# Patient Record
Sex: Female | Born: 1980 | Race: White | Hispanic: No | Smoking: Former smoker
Health system: Southern US, Community
[De-identification: ages and names within clinical notes are randomized; demographics above are authoritative.]

## PROBLEM LIST (undated history)

## (undated) DIAGNOSIS — R569 Unspecified convulsions: Secondary | ICD-10-CM

## (undated) DIAGNOSIS — R42 Dizziness and giddiness: Secondary | ICD-10-CM

## (undated) DIAGNOSIS — N201 Calculus of ureter: Secondary | ICD-10-CM

## (undated) DIAGNOSIS — N200 Calculus of kidney: Secondary | ICD-10-CM

## (undated) DIAGNOSIS — J45909 Unspecified asthma, uncomplicated: Secondary | ICD-10-CM

## (undated) DIAGNOSIS — Z973 Presence of spectacles and contact lenses: Secondary | ICD-10-CM

## (undated) DIAGNOSIS — M1990 Unspecified osteoarthritis, unspecified site: Secondary | ICD-10-CM

## (undated) HISTORY — DX: Unspecified osteoarthritis, unspecified site: M19.90

## (undated) HISTORY — PX: KNEE SURGERY: SHX244

## (undated) HISTORY — DX: Unspecified convulsions: R56.9

## (undated) HISTORY — DX: Dizziness and giddiness: R42

---
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
## 2002-02-03 ENCOUNTER — Emergency Department (HOSPITAL_COMMUNITY): Admission: EM | Admit: 2002-02-03 | Discharge: 2002-02-03 | Payer: Self-pay | Admitting: Internal Medicine

## 2002-07-24 ENCOUNTER — Emergency Department (HOSPITAL_COMMUNITY): Admission: EM | Admit: 2002-07-24 | Discharge: 2002-07-24 | Payer: Self-pay | Admitting: Emergency Medicine

## 2004-09-05 HISTORY — PX: TUBAL LIGATION: SHX77

## 2012-06-14 ENCOUNTER — Emergency Department (HOSPITAL_COMMUNITY): Payer: No Typology Code available for payment source

## 2012-06-14 ENCOUNTER — Encounter (HOSPITAL_COMMUNITY): Payer: Self-pay

## 2012-06-14 ENCOUNTER — Emergency Department (HOSPITAL_COMMUNITY)
Admission: EM | Admit: 2012-06-14 | Discharge: 2012-06-14 | Disposition: A | Discharge status: Home / Self Care | Payer: No Typology Code available for payment source | Attending: Emergency Medicine | Admitting: Emergency Medicine

## 2012-06-14 DIAGNOSIS — M545 Low back pain, unspecified: Secondary | ICD-10-CM | POA: Insufficient documentation

## 2012-06-14 DIAGNOSIS — S29019A Strain of muscle and tendon of unspecified wall of thorax, initial encounter: Secondary | ICD-10-CM

## 2012-06-14 DIAGNOSIS — F17200 Nicotine dependence, unspecified, uncomplicated: Secondary | ICD-10-CM | POA: Insufficient documentation

## 2012-06-14 DIAGNOSIS — M546 Pain in thoracic spine: Secondary | ICD-10-CM | POA: Insufficient documentation

## 2012-06-14 DIAGNOSIS — S139XXA Sprain of joints and ligaments of unspecified parts of neck, initial encounter: Secondary | ICD-10-CM | POA: Insufficient documentation

## 2012-06-14 DIAGNOSIS — S161XXA Strain of muscle, fascia and tendon at neck level, initial encounter: Secondary | ICD-10-CM

## 2012-06-14 DIAGNOSIS — M542 Cervicalgia: Secondary | ICD-10-CM | POA: Insufficient documentation

## 2012-06-14 DIAGNOSIS — S239XXA Sprain of unspecified parts of thorax, initial encounter: Secondary | ICD-10-CM | POA: Insufficient documentation

## 2012-06-14 MED ORDER — OXYCODONE-ACETAMINOPHEN 5-325 MG PO TABS: 1.0000 | ORAL_TABLET | ORAL | Status: AC | PRN

## 2012-06-14 NOTE — ED Notes (Signed)
c-collar placed in triage, due to complaints of neck pain.
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
## 2013-10-14 ENCOUNTER — Encounter (HOSPITAL_COMMUNITY): Payer: Self-pay | Admitting: Emergency Medicine

## 2013-10-14 ENCOUNTER — Emergency Department (HOSPITAL_COMMUNITY): Payer: 59

## 2013-10-14 ENCOUNTER — Emergency Department (HOSPITAL_COMMUNITY)
Admission: EM | Admit: 2013-10-14 | Discharge: 2013-10-14 | Disposition: A | Discharge status: Home / Self Care | Payer: 59 | Attending: Emergency Medicine | Admitting: Emergency Medicine

## 2013-10-14 DIAGNOSIS — Z87891 Personal history of nicotine dependence: Secondary | ICD-10-CM | POA: Insufficient documentation

## 2013-10-14 DIAGNOSIS — S61209A Unspecified open wound of unspecified finger without damage to nail, initial encounter: Secondary | ICD-10-CM | POA: Insufficient documentation

## 2013-10-14 DIAGNOSIS — S61309A Unspecified open wound of unspecified finger with damage to nail, initial encounter: Secondary | ICD-10-CM

## 2013-10-14 DIAGNOSIS — Y9389 Activity, other specified: Secondary | ICD-10-CM | POA: Insufficient documentation

## 2013-10-14 DIAGNOSIS — J45909 Unspecified asthma, uncomplicated: Secondary | ICD-10-CM | POA: Insufficient documentation

## 2013-10-14 DIAGNOSIS — Y929 Unspecified place or not applicable: Secondary | ICD-10-CM | POA: Insufficient documentation

## 2013-10-14 DIAGNOSIS — S6990XA Unspecified injury of unspecified wrist, hand and finger(s), initial encounter: Secondary | ICD-10-CM | POA: Insufficient documentation

## 2013-10-14 DIAGNOSIS — W230XXA Caught, crushed, jammed, or pinched between moving objects, initial encounter: Secondary | ICD-10-CM | POA: Insufficient documentation

## 2013-10-14 DIAGNOSIS — Z23 Encounter for immunization: Secondary | ICD-10-CM | POA: Insufficient documentation

## 2013-10-14 HISTORY — DX: Unspecified asthma, uncomplicated: J45.909

## 2013-10-14 MED ORDER — TETANUS-DIPHTH-ACELL PERTUSSIS 5-2.5-18.5 LF-MCG/0.5 IM SUSP: 0.5000 mL | Freq: Once | INTRAMUSCULAR | Status: DC

## 2013-10-14 MED ORDER — BUPIVACAINE HCL (PF) 0.5 % IJ SOLN
INTRAMUSCULAR | Status: AC
  Administered 2013-10-14: 20:00:00
  Filled 2013-10-14: qty 30

## 2013-10-14 MED ORDER — TETANUS-DIPHTHERIA TOXOIDS TD 5-2 LFU IM INJ
0.5000 mL | INJECTION | Freq: Once | INTRAMUSCULAR | Status: AC
Start: 2013-10-14 — End: 2013-10-14
  Administered 2013-10-14: 0.5 mL via INTRAMUSCULAR
  Filled 2013-10-14: qty 0.5

## 2013-10-14 MED ORDER — NAPROXEN 500 MG PO TABS: 500.0000 mg | ORAL_TABLET | Freq: Two times a day (BID) | ORAL | Status: DC

## 2013-10-14 MED ORDER — TETANUS-DIPHTH-ACELL PERTUSSIS 5-2.5-18.5 LF-MCG/0.5 IM SUSP
INTRAMUSCULAR | Status: AC
  Filled 2013-10-14: qty 0.5

## 2013-10-14 NOTE — Discharge Instructions (Signed)
Nail Avulsion Injury Nail avulsion means that you have lost the whole, or part of a nail. The nail will usually grow back in 2 to 6 months. If your injury damaged the growth center of the nail, the nail may be deformed, split, or not stuck to the nail bed. Sometimes the avulsed nail is stitched back in place. This provides temporary protection to the nail bed until the new nail grows in.  HOME CARE INSTRUCTIONS   Raise (elevate) your injury as much as possible.  Use an ice pack for pain relief.  Protect the injury and cover it with bandages (dressings) or splints as instructed.  Change dressings as instructed. SEEK MEDICAL CARE IF:   There is increasing pain, redness, or swelling.  You cannot move your fingers or toes. Document Released: 09/29/2004 Document Revised: 11/14/2011 Document Reviewed: 07/24/2009 [facility] Patient Information 2014 [employer] [place], [place].
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
## 2013-10-15 NOTE — ED Provider Notes (Signed)
CSN: [id number]     Arrival date & time 10/14/13  1703 History   First MD Initiated Contact with Patient 10/14/13 1855     Chief Complaint  Patient presents with  . Hand Pain     (Consider location/radiation/quality/duration/timing/severity/associated sxs/prior Treatment) HPI Comments: Susan Jensen is a 33 y.o. Female presenting with a crush injury and partial nail avulsion to her right long finger when she caught it in her car door just prior to arrival.  Her right index and ring fingers are also sore, but tolerable compared to the long finger.  She wears acrylic nails and has continued bleeding from along the edge of the nail plate.  Her pain is constant, throbbing and does not radiate.  She has used ice and elevation with mild improvement in pain.     The history is provided by the patient.    Past Medical History  Diagnosis Date  . Asthma    Past Surgical History  Procedure Laterality Date  . Knee surgery    . Tubal ligation     Family History  Problem Relation Age of Onset  . Cancer Other   . Heart failure Other    History  Substance Use Topics  . Smoking status: Former Smoker -- 1.00 packs/day for 10 years    Types: Cigarettes    Quit date: 04/07/2013  . Smokeless tobacco: Never Used  . Alcohol Use: No   OB History   Grav Para Term Preterm Abortions TAB SAB Ect Mult Living   3 3 3       3      Review of Systems  Constitutional: Negative for fever.  Musculoskeletal: Positive for arthralgias and joint swelling. Negative for myalgias.  Neurological: Negative for weakness and numbness.      Allergies  Ibuprofen and Pertussis vaccines  Home Medications   Current Outpatient Rx  Name  Route  Sig  Dispense  Refill  . naproxen (NAPROSYN) 500 MG tablet   Oral   Take 1 tablet (500 mg total) by mouth 2 (two) times daily.   30 tablet   0    BP 142/82  Pulse 86  Temp(Src) 98.4 F (36.9 C) (Oral)  Resp 20  Ht 5\' 4"  (1.626 m)  Wt 200 lb (90.719 kg)   BMI 34.31 kg/m2  SpO2 100%  LMP 10/14/2013 Physical Exam  Constitutional: She is oriented to person, place, and time. She appears well-developed and well-nourished.  HENT:  Head: Atraumatic.  Cardiovascular:  Pulses equal bilaterally  Musculoskeletal: She exhibits tenderness.       Right hand: She exhibits decreased range of motion and tenderness. She exhibits normal capillary refill, no deformity and no swelling.  Pain and erythema right distal long finger,  Nail plate with acrylic nail avulsed distal one third, linear.  Blood slowly oozing from along fractured nail plate edge.  No nailbed laceration appreciated.  Distal cap refill less than 3 seconds, sensation intact. Remaining nail plate is solidly attached.  Neurological: She is alert and oriented to person, place, and time. No sensory deficit.  Skin: Skin is warm and dry.  Psychiatric: She has a normal mood and affect.    ED Course  Procedures (including critical care time) Labs Review Labs Reviewed - No data to display Imaging Review Dg Hand Complete Right  10/14/2013   CLINICAL DATA:  Hand injury with pain in the distal fingers.  EXAM: RIGHT HAND - COMPLETE 3+ VIEW  COMPARISON:  None.  FINDINGS: No acute  osseous or joint abnormality.  IMPRESSION: No acute osseous or joint abnormality.   Electronically Signed   By: Leanna BattlesMelinda  Blietz M.D.   On: 10/14/2013 18:48    EKG Interpretation   None       MDM   Final diagnoses:  Fingernail avulsion, partial    xrays reviewed and negative for fracture.  Pt was given a digital block using 0.5% bupivacaine 1 cc using sterile technique,  Adequate block achieved prior to exploration of wound.  Pt was advised she may lose the remaining nail plate but is currently intact and will remain giving protection as the nail grows out. Advised to keep it clipped short to avoid further injury to nail/bed. Pt understands.  She was dressed in a bulky dressing, prescribed naproxen, encouraged  ice/elevation.  Tetanus updated.  Encouraged f/u with pcp in 7-10 days if pain not improving or for other concerns.    Burgess AmorJulie Shakendra Griffeth, PA-C 10/15/13 1529

## 2013-10-17 NOTE — ED Provider Notes (Signed)
Medical screening examination/treatment/procedure(s) were performed by non-physician practitioner and as supervising physician I was immediately available for consultation/collaboration.  EKG Interpretation   None         Iliya Spivack L Ryszard Socarras, MD 10/17/13 1434 

## 2014-07-07 ENCOUNTER — Encounter (HOSPITAL_COMMUNITY): Payer: Self-pay | Admitting: Emergency Medicine

## 2014-11-24 ENCOUNTER — Emergency Department (HOSPITAL_COMMUNITY)
Admission: EM | Admit: 2014-11-24 | Discharge: 2014-11-24 | Disposition: A | Discharge status: Home / Self Care | Payer: 59 | Attending: Emergency Medicine | Admitting: Emergency Medicine

## 2014-11-24 ENCOUNTER — Emergency Department (HOSPITAL_COMMUNITY): Payer: 59

## 2014-11-24 ENCOUNTER — Encounter (HOSPITAL_COMMUNITY): Payer: Self-pay

## 2014-11-24 DIAGNOSIS — Z79899 Other long term (current) drug therapy: Secondary | ICD-10-CM | POA: Diagnosis not present

## 2014-11-24 DIAGNOSIS — N390 Urinary tract infection, site not specified: Secondary | ICD-10-CM | POA: Diagnosis not present

## 2014-11-24 DIAGNOSIS — Z3202 Encounter for pregnancy test, result negative: Secondary | ICD-10-CM | POA: Insufficient documentation

## 2014-11-24 DIAGNOSIS — N201 Calculus of ureter: Secondary | ICD-10-CM | POA: Diagnosis not present

## 2014-11-24 DIAGNOSIS — Z791 Long term (current) use of non-steroidal anti-inflammatories (NSAID): Secondary | ICD-10-CM | POA: Insufficient documentation

## 2014-11-24 DIAGNOSIS — R109 Unspecified abdominal pain: Secondary | ICD-10-CM

## 2014-11-24 DIAGNOSIS — J45909 Unspecified asthma, uncomplicated: Secondary | ICD-10-CM | POA: Insufficient documentation

## 2014-11-24 DIAGNOSIS — Z9851 Tubal ligation status: Secondary | ICD-10-CM | POA: Insufficient documentation

## 2014-11-24 DIAGNOSIS — Z87891 Personal history of nicotine dependence: Secondary | ICD-10-CM | POA: Diagnosis not present

## 2014-11-24 LAB — CBC
HEMATOCRIT: 36.5 % (ref 36.0–46.0)
Hemoglobin: 12.7 g/dL (ref 12.0–15.0)
MCH: 29.7 pg (ref 26.0–34.0)
MCHC: 34.8 g/dL (ref 30.0–36.0)
MCV: 85.3 fL (ref 78.0–100.0)
Platelets: 346 10*3/uL (ref 150–400)
RBC: 4.28 MIL/uL (ref 3.87–5.11)
RDW: 12.7 % (ref 11.5–15.5)
WBC: 12 10*3/uL — AB (ref 4.0–10.5)

## 2014-11-24 LAB — URINALYSIS, ROUTINE W REFLEX MICROSCOPIC
GLUCOSE, UA: NEGATIVE mg/dL
Leukocytes, UA: NEGATIVE
Nitrite: POSITIVE — AB
Specific Gravity, Urine: >1.03 — ABNORMAL HIGH (ref 1.005–1.030)
UROBILINOGEN UA: 1 mg/dL (ref 0.0–1.0)
pH: 6.5 (ref 5.0–8.0)

## 2014-11-24 LAB — URINE MICROSCOPIC-ADD ON

## 2014-11-24 LAB — BASIC METABOLIC PANEL
Anion gap: 9 (ref 5–15)
BUN: 19 mg/dL (ref 6–23)
CO2: 22 mmol/L (ref 19–32)
CREATININE: 0.72 mg/dL (ref 0.50–1.10)
Calcium: 9.2 mg/dL (ref 8.4–10.5)
Chloride: 109 mmol/L (ref 96–112)
GLUCOSE: 104 mg/dL — AB (ref 70–99)
POTASSIUM: 3.7 mmol/L (ref 3.5–5.1)
Sodium: 140 mmol/L (ref 135–145)

## 2014-11-24 LAB — PREGNANCY, URINE: PREG TEST UR: NEGATIVE

## 2014-11-24 MED ORDER — HYDROMORPHONE HCL 1 MG/ML IJ SOLN
1.0000 mg | Freq: Once | INTRAMUSCULAR | Status: AC
  Administered 2014-11-24: 1 mg via INTRAVENOUS
  Filled 2014-11-24: qty 1

## 2014-11-24 MED ORDER — ONDANSETRON HCL 4 MG/2ML IJ SOLN
4.0000 mg | Freq: Once | INTRAMUSCULAR | Status: AC
  Administered 2014-11-24: 4 mg via INTRAVENOUS
  Filled 2014-11-24: qty 2

## 2014-11-24 MED ORDER — ONDANSETRON 4 MG PO TBDP: 4.0000 mg | ORAL_TABLET | Freq: Three times a day (TID) | ORAL | Status: DC | PRN

## 2014-11-24 MED ORDER — HYDROCODONE-ACETAMINOPHEN 5-325 MG PO TABS
1.0000 | ORAL_TABLET | Freq: Once | ORAL | Status: AC
Start: 2014-11-24 — End: 2014-11-24
  Administered 2014-11-24: 1 via ORAL

## 2014-11-24 MED ORDER — DEXTROSE 5 % IV SOLN
1.0000 g | Freq: Once | INTRAVENOUS | Status: AC
  Administered 2014-11-24: 1 g via INTRAVENOUS
  Filled 2014-11-24: qty 10

## 2014-11-24 MED ORDER — HYDROCODONE-ACETAMINOPHEN 5-325 MG PO TABS: 1.0000 | ORAL_TABLET | Freq: Four times a day (QID) | ORAL | Status: DC | PRN

## 2014-11-24 MED ORDER — SODIUM CHLORIDE 0.9 % IV BOLUS (SEPSIS)
1000.0000 mL | Freq: Once | INTRAVENOUS | Status: AC
  Administered 2014-11-24: 1000 mL via INTRAVENOUS

## 2014-11-24 MED ORDER — SODIUM CHLORIDE 0.9 % IV SOLN: INTRAVENOUS | Status: DC

## 2014-11-24 MED ORDER — CEPHALEXIN 500 MG PO CAPS: 500.0000 mg | ORAL_CAPSULE | Freq: Four times a day (QID) | ORAL | Status: DC

## 2014-11-24 MED ORDER — HYDROCODONE-ACETAMINOPHEN 5-325 MG PO TABS
ORAL_TABLET | ORAL | Status: AC
  Filled 2014-11-24: qty 1

## 2014-11-24 MED ORDER — PROMETHAZINE HCL 25 MG PO TABS: 25.0000 mg | ORAL_TABLET | Freq: Four times a day (QID) | ORAL | Status: DC | PRN

## 2014-11-24 NOTE — ED Notes (Signed)
Patient states that she is hurting on the right side of her back around to her belly button. States that she had blood in her urine and that she is nauseated.

## 2014-11-24 NOTE — Discharge Instructions (Signed)
Has been discussed study of 2 right-sided the kidney stones. It'll be important to follow-up with urology. Call later today for follow-up he may have difficulty passing these. Some evidence of a urinary tract infection. Take the antibiotic as directed. Take the hydrocodone as needed for pain. Take the Zofran as needed for nausea and vomiting. Can also take the Phenergan along with that. Return for any new or worse symptoms to include fever persistent vomiting inability to take medications.

## 2014-11-24 NOTE — ED Provider Notes (Signed)
CSN: [id number]     Arrival date & time 11/24/14  0206 History   First MD Initiated Contact with Patient 11/24/14 0256     Chief Complaint  Patient presents with  . Flank Pain     (Consider location/radiation/quality/duration/timing/severity/associated sxs/prior Treatment) Patient is a 34 y.o. female presenting with flank pain.  Flank Pain Associated symptoms include abdominal pain. Pertinent negatives include no chest pain and no headaches.   patient with acute onset of right flank pain right back pain radiating to right lower quadrant. Started yesterday. 10 out of 10. Associated with blood in the urine and a feeling of urgency. No fevers. Associated with nausea and vomiting. No history of kidney stones however her mother's had many. Pain is not made better or worse by anything.  Past Medical History  Diagnosis Date  . Asthma    Past Surgical History  Procedure Laterality Date  . Knee surgery    . Tubal ligation     Family History  Problem Relation Age of Onset  . Cancer Other   . Heart failure Other    History  Substance Use Topics  . Smoking status: Former Smoker -- 1.00 packs/day for 10 years    Types: Cigarettes    Quit date: 04/07/2013  . Smokeless tobacco: Never Used  . Alcohol Use: No   OB History    Gravida Para Term Preterm AB TAB SAB Ectopic Multiple Living   Review of Systems  Constitutional: Negative for fever.  HENT: Negative for congestion.   Eyes: Negative for visual disturbance.  Cardiovascular: Negative for chest pain.  Gastrointestinal: Positive for nausea, vomiting and abdominal pain.  Genitourinary: Positive for urgency, hematuria and flank pain.  Musculoskeletal: Positive for back pain.  Skin: Negative for rash.  Neurological: Negative for headaches.  Hematological: Does not bruise/bleed easily.  Psychiatric/Behavioral: Negative for confusion.      Allergies  Ibuprofen and Pertussis vaccines  Home Medications    Prior to Admission medications   Medication Sig Start Date End Date Taking? Authorizing Provider  cephALEXin (KEFLEX) 500 MG capsule Take 1 capsule (500 mg total) by mouth 4 (four) times daily. 11/24/14   Vanetta Mulders, MD  HYDROcodone-acetaminophen (NORCO/VICODIN) 5-325 MG per tablet Take 1-2 tablets by mouth every 6 (six) hours as needed. 11/24/14   Vanetta Mulders, MD  naproxen (NAPROSYN) 500 MG tablet Take 1 tablet (500 mg total) by mouth 2 (two) times daily. 10/14/13   Burgess Amor, PA-C  ondansetron (ZOFRAN ODT) 4 MG disintegrating tablet Take 1 tablet (4 mg total) by mouth every 8 (eight) hours as needed. 11/24/14   Vanetta Mulders, MD  promethazine (PHENERGAN) 25 MG tablet Take 1 tablet (25 mg total) by mouth every 6 (six) hours as needed. 11/24/14   Vanetta Mulders, MD   BP 127/74 mmHg  Pulse 90  Temp(Src) 98.5 F (36.9 C) (Oral)  Resp 22  Ht  (1.626 m)  Wt 205 lb (92.987 kg)  BMI 35.17 kg/m2  SpO2 100%  LMP 10/23/2014 (Approximate) Physical Exam  Constitutional: She is oriented to person, place, and time. She appears well-developed and well-nourished. No distress.  HENT:  Head: Normocephalic and atraumatic.  Mouth/Throat: Oropharynx is clear and moist.  Eyes: Conjunctivae and EOM are normal. Pupils are equal, round, and reactive to light.  Neck: Normal range of motion.  Cardiovascular: Normal rate, regular rhythm and normal heart sounds.   Pulmonary/Chest: Effort normal  and breath sounds normal. No respiratory distress.  Abdominal: Soft. Bowel sounds are normal. There is no tenderness.  Musculoskeletal: Normal range of motion.  Neurological: She is alert and oriented to person, place, and time. No cranial nerve deficit. She exhibits normal muscle tone. Coordination normal.  Skin: Skin is warm. No rash noted.  Nursing note and vitals reviewed.   ED Course  Procedures (including critical care time) Labs Review Labs Reviewed  BASIC METABOLIC PANEL - Abnormal; Notable  for the following:    Glucose, Bld 104 (*)    All other components within normal limits  CBC - Abnormal; Notable for the following:    WBC 12.0 (*)    All other components within normal limits  URINALYSIS, ROUTINE W REFLEX MICROSCOPIC - Abnormal; Notable for the following:    Color, Urine RED (*)    APPearance CLOUDY (*)    Specific Gravity, Urine >1.030 (*)    Hgb urine dipstick LARGE (*)    Bilirubin Urine MODERATE (*)    Ketones, ur TRACE (*)    Protein, ur >300 (*)    Nitrite POSITIVE (*)    All other components within normal limits  URINE MICROSCOPIC-ADD ON - Abnormal; Notable for the following:    Squamous Epithelial / LPF FEW (*)    Bacteria, UA MANY (*)    All other components within normal limits  URINE CULTURE  PREGNANCY, URINE   Results for orders placed or performed during the hospital encounter of 11/24/14  Basic metabolic panel  Result Value Ref Range   Sodium 140 135 - 145 mmol/L   Potassium 3.7 3.5 - 5.1 mmol/L   Chloride 109 96 - 112 mmol/L   CO2 22 19 - 32 mmol/L   Glucose, Bld 104 (H) 70 - 99 mg/dL   BUN 19 6 - 23 mg/dL   Creatinine, Ser 6.29 0.50 - 1.10 mg/dL   Calcium 9.2 8.4 - 52.8 mg/dL   GFR calc non Af Amer >90 >90 mL/min   GFR calc Af Amer >90 >90 mL/min   Anion gap 9 5 - 15  CBC  Result Value Ref Range   WBC 12.0 (H) 4.0 - 10.5 K/uL   RBC 4.28 3.87 - 5.11 MIL/uL   Hemoglobin 12.7 12.0 - 15.0 g/dL   HCT 41.3 24.4 - 01.0 %   MCV 85.3 78.0 - 100.0 fL   MCH 29.7 26.0 - 34.0 pg   MCHC 34.8 30.0 - 36.0 g/dL   RDW 27.2 53.6 - 64.4 %   Platelets 346 150 - 400 K/uL  Urinalysis, Routine w reflex microscopic  Result Value Ref Range   Color, Urine RED (A) YELLOW   APPearance CLOUDY (A) CLEAR   Specific Gravity, Urine >1.030 (H) 1.005 - 1.030   pH 6.5 5.0 - 8.0   Glucose, UA NEGATIVE NEGATIVE mg/dL   Hgb urine dipstick LARGE (A) NEGATIVE   Bilirubin Urine MODERATE (A) NEGATIVE   Ketones, ur TRACE (A) NEGATIVE mg/dL   Protein, ur >034 (A)  NEGATIVE mg/dL   Urobilinogen, UA 1.0 0.0 - 1.0 mg/dL   Nitrite POSITIVE (A) NEGATIVE   Leukocytes, UA NEGATIVE NEGATIVE  Pregnancy, urine  Result Value Ref Range   Preg Test, Ur NEGATIVE NEGATIVE  Urine microscopic-add on  Result Value Ref Range   Squamous Epithelial / LPF FEW (A) RARE   WBC, UA 7-10 <3 WBC/hpf   RBC / HPF TOO NUMEROUS TO COUNT <3 RBC/hpf   Bacteria, UA MANY (A) RARE  Urine-Other MANY YEAST      Imaging Review Ct Abdomen Pelvis Wo Contrast  11/24/2014   CLINICAL DATA:  Initial evaluation for right severe acute right flank pain. Nausea.  EXAM: CT ABDOMEN AND PELVIS WITHOUT CONTRAST  TECHNIQUE: Multidetector CT imaging of the abdomen and pelvis was performed following the standard protocol without IV contrast.  COMPARISON:  None.  FINDINGS: The visualized lung bases are clear. No pleural or pericardial effusion. Incidental note made of a 5 mm nodule within the left lower lobe (series 6, image 6). Additional 4 mm nodule present within the right lung base (series 6, image 4).  Limited noncontrast evaluation of the liver is unremarkable. Gallbladder within normal limits. No biliary dilatation. Spleen, adrenal glands, and pancreas are within normal limits.  Left kidney unremarkable without evidence for nephrolithiasis or hydronephrosis.  On the right, there is are 2 adjacent obstructive stones stacked at the right UPJ, he each measuring approximately 6 mm each adjacent periureteral fat stranding present. There is secondary moderate right hydronephrosis. No other stones seen along the course of the right renal collecting system. Additional nonobstructive 4 mm stone present within the lower pole of the right kidney.  Stomach within normal limits. No evidence for bowel obstruction. No acute inflammatory changes seen about the bowels. Appendix normal.  Bladder within normal limits. Uterus and ovaries are normal. Tubal ligation clips present.  No free air or fluid.  No adenopathy.  No  acute osseous abnormality. No worrisome lytic or blastic osseous lesion.  IMPRESSION: 1. Two adjacent 6 mm obstructive stones stacked on top of each other near the right UPJ with secondary moderate right hydronephrosis. 2. Additional 4 mm nonobstructive right renal calculus. 3. Subcentimeter nodules measuring up to 5 mm within the partially visualized lung bases. These are indeterminate. Please note that Fleischner criteria do not apply in patients of this age.   Electronically Signed   By: Rise MuBenjamin  McClintock M.D.   On: 11/24/2014 05:18     EKG Interpretation None      MDM   Final diagnoses:  Flank pain  Ureteral calculus, right  UTI (lower urinary tract infection)    CT scan consistent with right ureteral stone. Actually has to that are 6 mm in size. With the evidence of urinary tract infection. Also some mild/moderate right hydronephrosis. Renal function is normal. Urine culture sent. Patient started on Rocephin here will be continued on Keflex. Also pain medicine and follow-up with urology. They will call later today for follow-up. Patient is aware that with the 2 stones and the size of then it may be difficult to pass them.    Vanetta MuldersScott Faye Strohman, MD 11/24/14 [phone]

## 2014-11-25 ENCOUNTER — Ambulatory Visit (HOSPITAL_BASED_OUTPATIENT_CLINIC_OR_DEPARTMENT_OTHER): Payer: 59 | Admitting: Anesthesiology

## 2014-11-25 ENCOUNTER — Ambulatory Visit (HOSPITAL_BASED_OUTPATIENT_CLINIC_OR_DEPARTMENT_OTHER)
Admission: RE | Admit: 2014-11-25 | Discharge: 2014-11-25 | Disposition: A | Discharge status: Home / Self Care | Payer: 59 | Source: Ambulatory Visit | Attending: Urology | Admitting: Urology

## 2014-11-25 ENCOUNTER — Encounter (HOSPITAL_BASED_OUTPATIENT_CLINIC_OR_DEPARTMENT_OTHER): Payer: Self-pay | Admitting: Anesthesiology

## 2014-11-25 ENCOUNTER — Other Ambulatory Visit: Payer: Self-pay | Admitting: Urology

## 2014-11-25 ENCOUNTER — Encounter (HOSPITAL_BASED_OUTPATIENT_CLINIC_OR_DEPARTMENT_OTHER)
Admission: RE | Disposition: A | Discharge status: Home / Self Care | Payer: Self-pay | Source: Ambulatory Visit | Attending: Urology

## 2014-11-25 DIAGNOSIS — N390 Urinary tract infection, site not specified: Secondary | ICD-10-CM | POA: Insufficient documentation

## 2014-11-25 DIAGNOSIS — Z87891 Personal history of nicotine dependence: Secondary | ICD-10-CM | POA: Insufficient documentation

## 2014-11-25 DIAGNOSIS — N23 Unspecified renal colic: Secondary | ICD-10-CM | POA: Diagnosis present

## 2014-11-25 DIAGNOSIS — J45909 Unspecified asthma, uncomplicated: Secondary | ICD-10-CM | POA: Insufficient documentation

## 2014-11-25 DIAGNOSIS — N132 Hydronephrosis with renal and ureteral calculous obstruction: Secondary | ICD-10-CM | POA: Diagnosis not present

## 2014-11-25 DIAGNOSIS — N200 Calculus of kidney: Secondary | ICD-10-CM

## 2014-11-25 HISTORY — DX: Calculus of kidney: N20.0

## 2014-11-25 HISTORY — PX: CYSTOSCOPY WITH STENT PLACEMENT: SHX5790

## 2014-11-25 HISTORY — DX: Presence of spectacles and contact lenses: Z97.3

## 2014-11-25 HISTORY — DX: Calculus of ureter: N20.1

## 2014-11-25 LAB — URINE CULTURE
COLONY COUNT: NO GROWTH
Culture: NO GROWTH

## 2014-11-25 SURGERY — CYSTOSCOPY, WITH STENT INSERTION
Anesthesia: General | Site: Bladder | Laterality: Right

## 2014-11-25 MED ORDER — TAMSULOSIN HCL 0.4 MG PO CAPS: 0.4000 mg | ORAL_CAPSULE | Freq: Every day | ORAL | Status: DC

## 2014-11-25 MED ORDER — CIPROFLOXACIN HCL 500 MG PO TABS: 500.0000 mg | ORAL_TABLET | Freq: Two times a day (BID) | ORAL | Status: DC

## 2014-11-25 MED ORDER — LACTATED RINGERS IV SOLN
INTRAVENOUS | Status: DC
  Administered 2014-11-25 (×2): via INTRAVENOUS
  Filled 2014-11-25: qty 1000

## 2014-11-25 MED ORDER — HYDROMORPHONE HCL 1 MG/ML IJ SOLN
0.2500 mg | INTRAMUSCULAR | Status: DC | PRN
  Filled 2014-11-25: qty 1

## 2014-11-25 MED ORDER — DOCUSATE SODIUM 100 MG PO CAPS: 100.0000 mg | ORAL_CAPSULE | Freq: Two times a day (BID) | ORAL | Status: DC | PRN

## 2014-11-25 MED ORDER — FENTANYL CITRATE 0.05 MG/ML IJ SOLN
INTRAMUSCULAR | Status: AC
  Filled 2014-11-25: qty 2

## 2014-11-25 MED ORDER — PHENAZOPYRIDINE HCL 200 MG PO TABS: 200.0000 mg | ORAL_TABLET | Freq: Three times a day (TID) | ORAL | Status: DC | PRN

## 2014-11-25 MED ORDER — MIDAZOLAM HCL 5 MG/5ML IJ SOLN
INTRAMUSCULAR | Status: DC | PRN
  Administered 2014-11-25: 2 mg via INTRAVENOUS

## 2014-11-25 MED ORDER — LIDOCAINE HCL (CARDIAC) 20 MG/ML IV SOLN
INTRAVENOUS | Status: DC | PRN
  Administered 2014-11-25: 50 mg via INTRAVENOUS

## 2014-11-25 MED ORDER — FENTANYL CITRATE 0.05 MG/ML IJ SOLN
INTRAMUSCULAR | Status: DC | PRN
  Administered 2014-11-25: 50 ug via INTRAVENOUS

## 2014-11-25 MED ORDER — SODIUM CHLORIDE 0.9 % IR SOLN
Status: DC | PRN
  Administered 2014-11-25: 3000 mL via INTRAVESICAL

## 2014-11-25 MED ORDER — DEXAMETHASONE SODIUM PHOSPHATE 4 MG/ML IJ SOLN
INTRAMUSCULAR | Status: DC | PRN
  Administered 2014-11-25: 10 mg via INTRAVENOUS

## 2014-11-25 MED ORDER — PHENAZOPYRIDINE HCL 200 MG PO TABS
200.0000 mg | ORAL_TABLET | Freq: Once | ORAL | Status: AC
  Administered 2014-11-25: 200 mg via ORAL
  Filled 2014-11-25: qty 1

## 2014-11-25 MED ORDER — SUCCINYLCHOLINE CHLORIDE 20 MG/ML IJ SOLN
INTRAMUSCULAR | Status: DC | PRN
  Administered 2014-11-25: 100 mg via INTRAVENOUS

## 2014-11-25 MED ORDER — BELLADONNA ALKALOIDS-OPIUM 16.2-60 MG RE SUPP
RECTAL | Status: DC | PRN
  Administered 2014-11-25: 1 via RECTAL

## 2014-11-25 MED ORDER — PROPOFOL 10 MG/ML IV BOLUS
INTRAVENOUS | Status: DC | PRN
  Administered 2014-11-25: 200 mg via INTRAVENOUS

## 2014-11-25 MED ORDER — HYDROCODONE-ACETAMINOPHEN 5-325 MG PO TABS: 1.0000 | ORAL_TABLET | Freq: Four times a day (QID) | ORAL | Status: DC | PRN

## 2014-11-25 MED ORDER — FENTANYL CITRATE 0.05 MG/ML IJ SOLN
INTRAMUSCULAR | Status: AC
  Filled 2014-11-25: qty 4

## 2014-11-25 MED ORDER — ALBUTEROL SULFATE HFA 108 (90 BASE) MCG/ACT IN AERS
INHALATION_SPRAY | RESPIRATORY_TRACT | Status: DC | PRN
  Administered 2014-11-25: 4 via RESPIRATORY_TRACT

## 2014-11-25 MED ORDER — MIDAZOLAM HCL 2 MG/2ML IJ SOLN
INTRAMUSCULAR | Status: AC
  Filled 2014-11-25: qty 2

## 2014-11-25 MED ORDER — OXYBUTYNIN CHLORIDE 5 MG PO TABS
5.0000 mg | ORAL_TABLET | Freq: Once | ORAL | Status: AC
  Administered 2014-11-25: 5 mg via ORAL
  Filled 2014-11-25: qty 1

## 2014-11-25 MED ORDER — IOHEXOL 350 MG/ML SOLN
INTRAVENOUS | Status: DC | PRN
  Administered 2014-11-25: 10 mL via URETHRAL

## 2014-11-25 MED ORDER — OXYBUTYNIN CHLORIDE 5 MG PO TABS: 5.0000 mg | ORAL_TABLET | Freq: Three times a day (TID) | ORAL | Status: DC

## 2014-11-25 MED ORDER — METOCLOPRAMIDE HCL 5 MG/ML IJ SOLN
INTRAMUSCULAR | Status: DC | PRN
  Administered 2014-11-25: 5 mg via INTRAVENOUS

## 2014-11-25 MED ORDER — LIDOCAINE HCL 2 % EX GEL
CUTANEOUS | Status: DC | PRN
  Administered 2014-11-25: 1 via URETHRAL

## 2014-11-25 MED ORDER — BELLADONNA ALKALOIDS-OPIUM 16.2-60 MG RE SUPP
RECTAL | Status: AC
  Filled 2014-11-25: qty 1

## 2014-11-25 MED ORDER — ACETAMINOPHEN 10 MG/ML IV SOLN
INTRAVENOUS | Status: DC | PRN
  Administered 2014-11-25: 1000 mg via INTRAVENOUS

## 2014-11-25 MED ORDER — ONDANSETRON HCL 4 MG/2ML IJ SOLN
INTRAMUSCULAR | Status: DC | PRN
Start: 2014-11-25 — End: 2014-11-25
  Administered 2014-11-25: 4 mg via INTRAVENOUS

## 2014-11-25 MED ORDER — PROMETHAZINE HCL 25 MG/ML IJ SOLN
6.2500 mg | INTRAMUSCULAR | Status: DC | PRN
  Filled 2014-11-25: qty 1

## 2014-11-25 MED ORDER — MEPERIDINE HCL 25 MG/ML IJ SOLN
6.2500 mg | INTRAMUSCULAR | Status: DC | PRN
  Filled 2014-11-25: qty 1

## 2014-11-25 MED ORDER — CIPROFLOXACIN IN D5W 400 MG/200ML IV SOLN
INTRAVENOUS | Status: AC
  Filled 2014-11-25: qty 200

## 2014-11-25 MED ORDER — FENTANYL CITRATE 0.05 MG/ML IJ SOLN
50.0000 ug | Freq: Once | INTRAMUSCULAR | Status: AC
  Administered 2014-11-25 (×2): 50 ug via INTRAVENOUS
  Filled 2014-11-25: qty 1

## 2014-11-25 MED ORDER — CIPROFLOXACIN IN D5W 400 MG/200ML IV SOLN
400.0000 mg | INTRAVENOUS | Status: AC
  Administered 2014-11-25: 400 mg via INTRAVENOUS
  Filled 2014-11-25: qty 200

## 2014-11-25 MED ORDER — PHENAZOPYRIDINE HCL 100 MG PO TABS
ORAL_TABLET | ORAL | Status: AC
  Filled 2014-11-25: qty 2

## 2014-11-25 MED ORDER — OXYBUTYNIN CHLORIDE 5 MG PO TABS
ORAL_TABLET | ORAL | Status: AC
  Filled 2014-11-25: qty 1

## 2014-11-25 SURGICAL SUPPLY — 15 items
BAG DRAIN URO-CYSTO SKYTR STRL (DRAIN) ×3 IMPLANT
CANISTER SUCT LVC 12 LTR MEDI- (MISCELLANEOUS) ×3 IMPLANT
CATH URET 5FR 28IN OPEN ENDED (CATHETERS) ×3 IMPLANT
CLOTH BEACON ORANGE TIMEOUT ST (SAFETY) ×3 IMPLANT
GLOVE BIO SURGEON STRL SZ 6.5 (GLOVE) ×2 IMPLANT
GLOVE BIO SURGEON STRL SZ7.5 (GLOVE) ×3 IMPLANT
GLOVE BIO SURGEONS STRL SZ 6.5 (GLOVE) ×1
GLOVE BIOGEL PI IND STRL 6.5 (GLOVE) ×1 IMPLANT
GLOVE BIOGEL PI INDICATOR 6.5 (GLOVE) ×2
GOWN STRL REUS W/ TWL LRG LVL3 (GOWN DISPOSABLE) ×1 IMPLANT
GOWN STRL REUS W/TWL LRG LVL3 (GOWN DISPOSABLE) ×2
GOWN STRL REUS W/TWL XL LVL3 (GOWN DISPOSABLE) ×3 IMPLANT
IV NS IRRIG 3000ML ARTHROMATIC (IV SOLUTION) ×6 IMPLANT
PACK CYSTO (CUSTOM PROCEDURE TRAY) ×3 IMPLANT
STENT POLARIS LOOP 6FR X 24 CM (STENTS) ×3 IMPLANT

## 2014-11-25 NOTE — Op Note (Signed)
Preoperative diagnosis:  1. Right ureteral stone 2. Urinary tract infection   Postoperative diagnosis:  1. same   Procedure:  1. Cystoscopy 2. right ureteral stent placement - 31F x 24cm 3. right retrograde pyelography with interpretation   Surgeon: Crist FatBenjamin W. Jeremian Whitby, MD  Anesthesia: General  Complications: None  Intraoperative findings: retrograde pyelogram demonstrated a filling defect within the distal ureter consistent with patient's stones.  The proximal ureter demonstrated hydronephrosis.  EBL: Minimal  Specimens: None  Indication: Susan Jensen is a 34 y.o. patient with right ureteral stone, UTI and poorly controlled pain. After reviewing the management options for treatment, he elected to proceed with the above surgical procedure(s). We have discussed the potential benefits and risks of the procedure, side effects of the proposed treatment, the likelihood of the patient achieving the goals of the procedure, and any potential problems that might occur during the procedure or recuperation. Informed consent has been obtained.  Description of procedure:  The patient was taken to the operating room and general anesthesia was induced.  The patient was placed in the dorsal lithotomy position, prepped and draped in the usual sterile fashion, and preoperative antibiotics were administered. A preoperative time-out was performed.   Cystourethroscopy was performed.  The patient's urethra was examined and was normal. The bladder was then systematically examined in its entirety. There was no evidence for any bladder tumors, stones, or other mucosal pathology.    Attention then turned to the rightureteral orifice and a ureteral catheter was used to intubate the ureteral orifice.  Omnipaque contrast was injected through the ureteral catheter and a retrograde pyelogram was performed with findings as dictated above.  A 0.38 sensor guidewire was then advanced up the right ureter into the  renal pelvis under fluoroscopic guidance.  The wire was then backloaded through the cystoscope and a ureteral stent was advance over the wire using Seldinger technique.  The stent was positioned appropriately under fluoroscopic and cystoscopic guidance.  The wire was then removed with an adequate stent curl noted in the renal pelvis as well as in the bladder.  The bladder was then emptied and the procedure ended.  The patient appeared to tolerate the procedure well and without complications.  The patient was able to be awakened and transferred to the recovery unit in satisfactory condition.    Crist FatBenjamin W. Karnell Vanderloop, M.D.

## 2014-11-25 NOTE — Anesthesia Preprocedure Evaluation (Addendum)
Anesthesia Evaluation  Patient identified by MRN, date of birth, ID band Patient awake    Reviewed: Allergy & Precautions, NPO status , Patient's Chart, lab work & pertinent test results  Airway Mallampati: II  TM Distance: >3 FB Neck ROM: Full    Dental no notable dental hx.    Pulmonary asthma , former smoker,  breath sounds clear to auscultation  Pulmonary exam normal       Cardiovascular negative cardio ROS  Rhythm:Regular Rate:Normal     Neuro/Psych negative neurological ROS  negative psych ROS   GI/Hepatic negative GI ROS, Neg liver ROS,   Endo/Other  negative endocrine ROS  Renal/GU negative Renal ROS     Musculoskeletal negative musculoskeletal ROS (+)   Abdominal   Peds  Hematology negative hematology ROS (+)   Anesthesia Other Findings   Reproductive/Obstetrics negative OB ROS                           Anesthesia Physical Anesthesia Plan  ASA: II  Anesthesia Plan: General   Post-op Pain Management:    Induction: Intravenous, Rapid sequence and Cricoid pressure planned  Airway Management Planned: Oral ETT  Additional Equipment: None  Intra-op Plan:   Post-operative Plan: Extubation in OR  Informed Consent: I have reviewed the patients History and Physical, chart, labs and discussed the procedure including the risks, benefits and alternatives for the proposed anesthesia with the patient or authorized representative who has indicated his/her understanding and acceptance.   Dental advisory given  Plan Discussed with: CRNA  Anesthesia Plan Comments:        Anesthesia Quick Evaluation

## 2014-11-25 NOTE — Discharge Instructions (Signed)
DISCHARGE INSTRUCTIONS FOR KIDNEY STONE/URETERAL STENT   MEDICATIONS:  1. Resume all your other meds from home - except do not take any extra narcotic pain meds that you may have at home.  2. Oxybutynin is to prevent bladder spasms and help reduce urinary frequency. 3. Flomax is for stent irritation. 4. Vicodin is for moderate/severe pain, otherwise taking upto  every 6 hours of plainTylenol will help treat your pain.  Do not take both at the same time. 5. Take Cipro twice daily x 7 days. 6. Pyridium is for burning with urination  ACTIVITY:  1. No strenuous activity x 1week  2. No driving while on narcotic pain medications  3. Drink plenty of water  4. Continue to walk at home - you can still get blood clots when you are at home, so keep active, but don't over do it.  5. May return to work/school tomorrow or when you feel ready   BATHING:  1. You can shower and we recommend daily showers   SIGNS/SYMPTOMS TO CALL:  Please call Susan Jensen if you have a fever greater than 101.5, uncontrolled nausea/vomiting, uncontrolled pain, dizziness, unable to urinate, bloody urine, chest pain, shortness of breath, leg swelling, leg pain, redness around wound, drainage from wound, or any other concerns or questions.   You can reach Susan Jensen at [phone].   FOLLOW-UP:  1. You will be contacted with a surgery date for removal of your stone in the next several days.[facility] [phone] Post Ureteroscopy With or Without Stent Instructions  Definitions:  Ureter: The duct that transports urine from the kidney to the bladder. Stent:   A plastic hollow tube that is placed into the ureter, from the kidney to the                 bladder to prevent the ureter from swelling shut.  GENERAL INSTRUCTIONS:  Despite the fact that no skin incisions were used, the area around the ureter and bladder is raw and irritated. The stent is a foreign body which will further irritate the bladder wall. This  irritation is manifested by increased frequency of urination, both day and night, and by an increase in the urge to urinate. In some, the urge to urinate is present almost always. Sometimes the urge is strong enough that you may not be able to stop yourself from urinating. The only real cure is to remove the stent and then give time for the bladder wall to heal which can't be done until the danger of the ureter swelling shut has passed, which varies.  You may see some blood in your urine while the stent is in place and a few days afterwards. Do not be alarmed, even if the urine was clear for a while. Get off your feet and drink lots of fluids until clearing occurs. If you start to pass clots or don't improve, call Susan Jensen.  DIET: You may return to your normal diet immediately. Because of the raw surface of your bladder, alcohol, spicy foods, acid type foods and drinks with caffeine may cause irritation or frequency and should be used in moderation. To keep your urine flowing freely and to avoid constipation, drink plenty of fluids during the day ( 8-10 glasses ). Tip: Avoid cranberry juice because it is very acidic.  ACTIVITY: Your physical activity doesn't need to be restricted. However, if you are very active, you may see some blood in your urine. We suggest that you reduce your activity under these circumstances  until the bleeding has stopped.  BOWELS: It is important to keep your bowels regular during the postoperative period. Straining with bowel movements can cause bleeding. A bowel movement every other day is reasonable. Use a mild laxative if needed, such as Milk of Magnesia 2-3 tablespoons, or 2 Dulcolax tablets. Call if you continue to have problems. If you have been taking narcotics for pain, before, during or after your surgery, you may be constipated. Take a laxative if necessary.   MEDICATION: You should resume your pre-surgery medications unless told not to. In addition you will often be  given an antibiotic to prevent infection. These should be taken as prescribed until the bottles are finished unless you are having an unusual reaction to one of the drugs.  PROBLEMS YOU SHOULD REPORT TO US:  Fevers over 100.5 Fahrenheit.  Heavy bleeding, or clots ( See above notes about blood in urine ).  Inability to urinate.  Drug reactions ( hives, rash, nausea, vomiting, diarrhea ).  Severe burning or pain with urination that is not improving.  FOLLOW-UP: You will need a follow-up appointment to monitor your progress. Call for this appointment at the number listed above. Usually the first appointment will be about three to fourteen days after your surgery.      Post Anesthesia Home Care Instructions  Activity: Get plenty of rest for the remainder of the day. A responsible adult should stay with you for 24 hours following the procedure.  For the next 24 hours, DO NOT: -Drive a car -Advertising copywriterperate machinery -Drink alcoholic beverages -Take any medication unless instructed by your physician -Make any legal decisions or sign important papers.  Meals: Start with liquid foods such as gelatin or soup. Progress to regular foods as tolerated. Avoid greasy, spicy, heavy foods. If nausea and/or vomiting occur, drink only clear liquids until the nausea and/or vomiting subsides. Call your physician if vomiting continues.  Special Instructions/Symptoms: Your throat may feel dry or sore from the anesthesia or the breathing tube placed in your throat during surgery. If this causes discomfort, gargle with warm salt water. The discomfort should disappear within 24 hours.

## 2014-11-25 NOTE — Anesthesia Postprocedure Evaluation (Signed)
Anesthesia Post Note  Patient: Susan Jensen  Procedure(s) Performed: Procedure(s) (LRB): CYSTOSCOPY, RIGHT RETROGRADE PYELOGRAM WITH STENT PLACEMENT (Right)  Anesthesia type: General  Patient location: PACU  Post pain: Pain level controlled  Post assessment: Post-op Vital signs reviewed  Last Vitals: BP 103/58 mmHg  Pulse 93  Temp(Src) 36.7 C (Oral)  Resp 12  Wt 202 lb 8 oz (91.853 kg)  SpO2 94%  LMP 10/23/2014 (Approximate)  Post vital signs: Reviewed  Level of consciousness: sedated  Complications: No apparent anesthesia complications

## 2014-11-25 NOTE — Transfer of Care (Signed)
Immediate Anesthesia Transfer of Care Note  Patient: Susan Jensen  Procedure(s) Performed: Procedure(s) (LRB): CYSTOSCOPY, RIGHT RETROGRADE PYELOGRAM WITH STENT PLACEMENT (Right)  Patient Location: PACU  Anesthesia Type: General  Level of Consciousness: awake, alert  and oriented  Airway & Oxygen Therapy: Patient Spontanous Breathing and Patient connected to face mask oxygen  Post-op Assessment: Report given to PACU RN and Post -op Vital signs reviewed and stable  Post vital signs: Reviewed and stable  Complications: No apparent anesthesia complications Last Vitals:  Filed Vitals:   11/25/14 1016  BP: 138/83  Pulse: 85  Temp: 36.9 C  Resp: 18

## 2014-11-25 NOTE — H&P (Signed)
Reason For Visit right renal colic   History of Present Illness 39F seen yesterday in the [facility] ED where she was found to have two 6mm stones in the proximal right ureter. She was started on medical explusive therapy. The patient describes her pain as progressive on onset starting 2 days ago. She is on unrelenting pain since. She has had associated nausea and vomiting. She denies any urgency or incontinence but is having dysuria. Denies any fevers or chills. She denies any neurological or GI changes. Physical patient's first stone.   Allergies Medication  1. Ibuprofen TABS  Review of Systems  Genitourinary: urinary urgency, nocturia, urinary hesitancy, hematuria, foul smelling urine, vaginal discharge and initiating urination requires straining.  Gastrointestinal: nausea, vomiting and heartburn.  ENT: sore throat.  Respiratory: cough.  Musculoskeletal: back pain and joint pain.  Neurological: headache.    Vitals Vital Signs [Data Includes: Last 1 Day]  Recorded: 22Mar2016 09:35AM  Height: 5 ft 4 in Weight: 205 lb  BMI Calculated: 35.19 BSA Calculated: 1.98 Blood Pressure: 129 / 84 Temperature: 97.3 F Heart Rate: 78  Physical Exam Constitutional: Well nourished and well developed . No acute distress.  ENT:. The ears and nose are normal in appearance.  Neck: The appearance of the neck is normal and no neck mass is present.  Pulmonary: No respiratory distress and normal respiratory rhythm and effort.  Cardiovascular: Heart rate and rhythm are normal . No peripheral edema.  Abdomen: Moderate tenderness in the RLQ is present. Right CVA tenderness and no left CVA tenderness.  Lymphatics: The femoral and inguinal nodes are not enlarged or tender.  Skin: Normal skin turgor, no visible rash and no visible skin lesions.  Neuro/Psych:. Mood and affect are appropriate.    Results/Data Urine [Data Includes: Last 1 Day]   22Mar2016  COLOR RED   APPEARANCE TURBID   SPECIFIC GRAVITY 1.030    pH 6.5   GLUCOSE NEG mg/dL  BILIRUBIN MOD   KETONE 15 mg/dL  BLOOD LARGE   PROTEIN > 300 mg/dL  UROBILINOGEN 1 mg/dL  NITRITE POS   LEUKOCYTE ESTERASE TRACE   SQUAMOUS EPITHELIAL/HPF RARE   WBC 3-6 WBC/hpf  RBC TNTC RBC/hpf  BACTERIA MANY   CRYSTALS NONE SEEN   CASTS NONE SEEN    The patient's urinalysis today demonstrates pyuria, hematuria and is nitrite positive. Urine culture was sent.  Evidently he reviewed the patient's CT scan performed 2 days prior showing to 6 mm stones in the mid right ureter with proximal hydronephrosis.   Assessment Assessed  1. Right ureteral calculus (N20.1)  The patient has 2 stones stacked up in the right mid ureter, poorly controlled pain, and appears to be a urinary tract infection.   Plan Health Maintenance  1. UA With REFLEX; [Do Not Release]; Status:Complete;   Done: 22Mar2016 09:25AM Right ureteral calculus  2. URINE CULTURE; Status:Hold For - Specimen/Data Collection,Appointment; Requested  for:22Mar2016;  3. Follow-up Schedule Surgery Office  Follow-up  Status: Complete  Done: 22Mar2016  Discussion/Summary I discussed management options for this patient and strongly encouraged her to consider stent placement this time to prevent her from progressing and developing a more serious infection. The patient has agreed to proceed. I did discuss ureteral stent placement with her in detail including the risks and benefits. After hearing all the information the patient has agreed to proceed.

## 2014-11-25 NOTE — Anesthesia Procedure Notes (Signed)
Procedure Name: Intubation Date/Time: 11/25/2014 11:08 AM Performed by: Norva PavlovALLAWAY, Susan Willems G Pre-anesthesia Checklist: Patient identified, Emergency Drugs available, Suction available and Patient being monitored Patient Re-evaluated:Patient Re-evaluated prior to inductionOxygen Delivery Method: Circle System Utilized Preoxygenation: Pre-oxygenation with 100% oxygen Intubation Type: IV induction, Cricoid Pressure applied and Rapid sequence Laryngoscope Size: Mac and 3 Grade View: Grade II Tube type: Oral Tube size: 7.0 mm Number of attempts: 1 Airway Equipment and Method: Stylet and Oral airway Placement Confirmation: ETT inserted through vocal cords under direct vision,  positive ETCO2 and breath sounds checked- equal and bilateral Secured at: 21 cm Tube secured with: Tape Dental Injury: Teeth and Oropharynx as per pre-operative assessment

## 2014-11-26 ENCOUNTER — Encounter (HOSPITAL_BASED_OUTPATIENT_CLINIC_OR_DEPARTMENT_OTHER): Payer: Self-pay | Admitting: Urology

## 2014-12-01 ENCOUNTER — Other Ambulatory Visit: Payer: Self-pay | Admitting: Urology

## 2014-12-16 ENCOUNTER — Encounter (HOSPITAL_BASED_OUTPATIENT_CLINIC_OR_DEPARTMENT_OTHER): Payer: Self-pay | Admitting: *Deleted

## 2014-12-16 NOTE — Progress Notes (Signed)
NPO AFTER MN WITH EXCEPTION CLEAR LIQUIDS UNTIL 0700.  ARRIVE AT 1145. CURRENT LAB RESULTS IN CHART AND EPIC.

## 2014-12-18 NOTE — H&P (Signed)
Reason For Visit right renal colic   History of Present Illness 81F seen yesterday in the [facility] where she was found to have two 6mm stones in the proximal right ureter. She was started on medical explusive therapy. The patient describes her pain as progressive on onset starting 2 days ago. She is on unrelenting pain since. She has had associated nausea and vomiting. She denies any urgency or incontinence but is having dysuria. Denies any fevers or chills. She denies any neurological or GI changes. Physical patient's first stone.   Allergies Medication  1. Ibuprofen TABS  Review of Systems  Genitourinary: urinary urgency, nocturia, urinary hesitancy, hematuria, foul smelling urine, vaginal discharge and initiating urination requires straining.  Gastrointestinal: nausea, vomiting and heartburn.  ENT: sore throat.  Respiratory: cough.  Musculoskeletal: back pain and joint pain.  Neurological: headache.    Vitals Vital Signs [Data Includes: Last 1 Day]  Recorded: 22Mar2016 09:35AM  Height: 5 ft 4 in Weight: 205 lb  BMI Calculated: 35.19 BSA Calculated: 1.98 Blood Pressure: 129 / 84 Temperature: 97.3 F Heart Rate: 78  Physical Exam Constitutional: Well nourished and well developed . No acute distress.  ENT:. The ears and nose are normal in appearance.  Neck: The appearance of the neck is normal and no neck mass is present.  Pulmonary: No respiratory distress and normal respiratory rhythm and effort.  Cardiovascular: Heart rate and rhythm are normal . No peripheral edema.  Abdomen: Moderate tenderness in the RLQ is present. Right CVA tenderness and no left CVA tenderness.  Lymphatics: The femoral and inguinal nodes are not enlarged or tender.  Skin: Normal skin turgor, no visible rash and no visible skin lesions.  Neuro/Psych:. Mood and affect are appropriate.    Results/Data Urine [Data Includes: Last 1 Day]   22Mar2016  COLOR RED   APPEARANCE TURBID   SPECIFIC GRAVITY 1.030    pH 6.5   GLUCOSE NEG mg/dL  BILIRUBIN MOD   KETONE 15 mg/dL  BLOOD LARGE   PROTEIN > 300 mg/dL  UROBILINOGEN 1 mg/dL  NITRITE POS   LEUKOCYTE ESTERASE TRACE   SQUAMOUS EPITHELIAL/HPF RARE   WBC 3-6 WBC/hpf  RBC TNTC RBC/hpf  BACTERIA MANY   CRYSTALS NONE SEEN   CASTS NONE SEEN    The patient's urinalysis today demonstrates pyuria, hematuria and is nitrite positive. Urine culture was sent.  Evidently he reviewed the patient's CT scan performed 2 days prior showing to 6 mm stones in the mid right ureter with proximal hydronephrosis.   Assessment Assessed  1. Right ureteral calculus (N20.1)  The patient has 2 stones stacked up in the right mid ureter, poorly controlled pain, and appears to be a urinary tract infection.   Plan Health Maintenance  1. UA With REFLEX; [Do Not Release]; Status:Complete;   Done: 22Mar2016 09:25AM Right ureteral calculus  2. URINE CULTURE; Status:Hold For - Specimen/Data Collection,Appointment; Requested  for:22Mar2016;  3. Follow-up Schedule Surgery Office  Follow-up  Status: Complete  Done: 22Mar2016  Discussion/Summary I discussed management options for this patient and strongly encouraged her to consider stent placement this time to prevent her from progressing and developing a more serious infection. The patient has agreed to proceed. I did discuss ureteral stent placement with her in detail including the risks and benefits. After hearing all the information the patient has agreed to proceed.

## 2014-12-19 ENCOUNTER — Encounter (HOSPITAL_BASED_OUTPATIENT_CLINIC_OR_DEPARTMENT_OTHER): Payer: Self-pay | Admitting: *Deleted

## 2014-12-19 ENCOUNTER — Ambulatory Visit (HOSPITAL_BASED_OUTPATIENT_CLINIC_OR_DEPARTMENT_OTHER): Payer: 59 | Admitting: Anesthesiology

## 2014-12-19 ENCOUNTER — Ambulatory Visit (HOSPITAL_BASED_OUTPATIENT_CLINIC_OR_DEPARTMENT_OTHER)
Admission: RE | Admit: 2014-12-19 | Discharge: 2014-12-19 | Disposition: A | Discharge status: Home / Self Care | Payer: 59 | Source: Ambulatory Visit | Attending: Urology | Admitting: Urology

## 2014-12-19 ENCOUNTER — Encounter (HOSPITAL_BASED_OUTPATIENT_CLINIC_OR_DEPARTMENT_OTHER)
Admission: RE | Disposition: A | Discharge status: Home / Self Care | Payer: Self-pay | Source: Ambulatory Visit | Attending: Urology

## 2014-12-19 DIAGNOSIS — Z887 Allergy status to serum and vaccine status: Secondary | ICD-10-CM | POA: Insufficient documentation

## 2014-12-19 DIAGNOSIS — Z87891 Personal history of nicotine dependence: Secondary | ICD-10-CM | POA: Diagnosis not present

## 2014-12-19 DIAGNOSIS — N201 Calculus of ureter: Secondary | ICD-10-CM | POA: Diagnosis not present

## 2014-12-19 DIAGNOSIS — Z886 Allergy status to analgesic agent status: Secondary | ICD-10-CM | POA: Insufficient documentation

## 2014-12-19 DIAGNOSIS — Z9851 Tubal ligation status: Secondary | ICD-10-CM | POA: Insufficient documentation

## 2014-12-19 DIAGNOSIS — J45909 Unspecified asthma, uncomplicated: Secondary | ICD-10-CM | POA: Insufficient documentation

## 2014-12-19 DIAGNOSIS — N200 Calculus of kidney: Secondary | ICD-10-CM

## 2014-12-19 HISTORY — PX: CYSTOSCOPY W/ URETERAL STENT PLACEMENT: SHX1429

## 2014-12-19 HISTORY — PX: CYSTOSCOPY WITH URETEROSCOPY: SHX5123

## 2014-12-19 HISTORY — PX: CYSTOSCOPY W/ RETROGRADES: SHX1426

## 2014-12-19 SURGERY — CYSTOSCOPY, FLEXIBLE, WITH STENT REPLACEMENT
Anesthesia: General | Site: Ureter | Laterality: Right

## 2014-12-19 MED ORDER — BELLADONNA ALKALOIDS-OPIUM 16.2-60 MG RE SUPP
RECTAL | Status: AC
  Filled 2014-12-19: qty 1

## 2014-12-19 MED ORDER — PROPOFOL 10 MG/ML IV BOLUS
INTRAVENOUS | Status: DC | PRN
  Administered 2014-12-19: 200 mg via INTRAVENOUS

## 2014-12-19 MED ORDER — GENTAMICIN SULFATE 40 MG/ML IJ SOLN
340.0000 mg | INTRAVENOUS | Status: AC
  Administered 2014-12-19: 340 mg via INTRAVENOUS
  Filled 2014-12-19: qty 8.5

## 2014-12-19 MED ORDER — PHENAZOPYRIDINE HCL 200 MG PO TABS: 200.0000 mg | ORAL_TABLET | Freq: Three times a day (TID) | ORAL | Status: DC | PRN

## 2014-12-19 MED ORDER — MIDAZOLAM HCL 5 MG/5ML IJ SOLN
INTRAMUSCULAR | Status: DC | PRN
  Administered 2014-12-19: 2 mg via INTRAVENOUS

## 2014-12-19 MED ORDER — LIDOCAINE HCL (CARDIAC) 20 MG/ML IV SOLN
INTRAVENOUS | Status: DC | PRN
  Administered 2014-12-19: 50 mg via INTRAVENOUS

## 2014-12-19 MED ORDER — TAMSULOSIN HCL 0.4 MG PO CAPS: 0.4000 mg | ORAL_CAPSULE | Freq: Every day | ORAL | Status: DC

## 2014-12-19 MED ORDER — CIPROFLOXACIN HCL 500 MG PO TABS: 500.0000 mg | ORAL_TABLET | Freq: Once | ORAL | Status: DC

## 2014-12-19 MED ORDER — MIDAZOLAM HCL 2 MG/2ML IJ SOLN
INTRAMUSCULAR | Status: AC
  Filled 2014-12-19: qty 2

## 2014-12-19 MED ORDER — ONDANSETRON HCL 4 MG/2ML IJ SOLN
INTRAMUSCULAR | Status: DC | PRN
  Administered 2014-12-19: 4 mg via INTRAVENOUS

## 2014-12-19 MED ORDER — FENTANYL CITRATE (PF) 100 MCG/2ML IJ SOLN
INTRAMUSCULAR | Status: DC | PRN
  Administered 2014-12-19 (×2): 50 ug via INTRAVENOUS

## 2014-12-19 MED ORDER — FENTANYL CITRATE (PF) 100 MCG/2ML IJ SOLN
INTRAMUSCULAR | Status: AC
  Filled 2014-12-19: qty 2

## 2014-12-19 MED ORDER — HYDROCODONE-ACETAMINOPHEN 5-325 MG PO TABS: 1.0000 | ORAL_TABLET | Freq: Four times a day (QID) | ORAL | Status: DC | PRN

## 2014-12-19 MED ORDER — OXYBUTYNIN CHLORIDE 5 MG PO TABS: 5.0000 mg | ORAL_TABLET | Freq: Three times a day (TID) | ORAL | Status: DC

## 2014-12-19 MED ORDER — FENTANYL CITRATE (PF) 100 MCG/2ML IJ SOLN
INTRAMUSCULAR | Status: AC
  Filled 2014-12-19: qty 4

## 2014-12-19 MED ORDER — DEXAMETHASONE SODIUM PHOSPHATE 4 MG/ML IJ SOLN
INTRAMUSCULAR | Status: DC | PRN
  Administered 2014-12-19: 10 mg via INTRAVENOUS

## 2014-12-19 MED ORDER — ACETAMINOPHEN 10 MG/ML IV SOLN
INTRAVENOUS | Status: DC | PRN
  Administered 2014-12-19: 1000 mg via INTRAVENOUS

## 2014-12-19 MED ORDER — KETOROLAC TROMETHAMINE 30 MG/ML IJ SOLN
INTRAMUSCULAR | Status: DC | PRN
  Administered 2014-12-19: 30 mg via INTRAVENOUS

## 2014-12-19 MED ORDER — LACTATED RINGERS IV SOLN
INTRAVENOUS | Status: DC
  Administered 2014-12-19 (×2): via INTRAVENOUS
  Filled 2014-12-19: qty 1000

## 2014-12-19 MED ORDER — PROMETHAZINE HCL 25 MG PO TABS: 25.0000 mg | ORAL_TABLET | Freq: Four times a day (QID) | ORAL | Status: DC | PRN

## 2014-12-19 MED ORDER — SODIUM CHLORIDE 0.9 % IV SOLN
2.0000 g | INTRAVENOUS | Status: AC
  Administered 2014-12-19: 2 g via INTRAVENOUS
  Filled 2014-12-19: qty 2000

## 2014-12-19 MED ORDER — LACTATED RINGERS IV SOLN
INTRAVENOUS | Status: DC
  Filled 2014-12-19: qty 1000

## 2014-12-19 MED ORDER — FENTANYL CITRATE (PF) 100 MCG/2ML IJ SOLN
25.0000 ug | INTRAMUSCULAR | Status: DC | PRN
  Administered 2014-12-19: 25 ug via INTRAVENOUS
  Filled 2014-12-19: qty 1

## 2014-12-19 MED ORDER — SODIUM CHLORIDE 0.9 % IR SOLN
Status: DC | PRN
  Administered 2014-12-19: 6000 mL

## 2014-12-19 SURGICAL SUPPLY — 36 items
ADAPTER CATH URET PLST 4-6FR (CATHETERS) IMPLANT
BAG DRAIN URO-CYSTO SKYTR STRL (DRAIN) ×4 IMPLANT
BASKET LASER NITINOL 1.9FR (BASKET) IMPLANT
BASKET STNLS GEMINI 4WIRE 3FR (BASKET) IMPLANT
BASKET STONE 1.7 NGAGE (UROLOGICAL SUPPLIES) ×4 IMPLANT
BASKET ZERO TIP NITINOL 2.4FR (BASKET) IMPLANT
CANISTER SUCT LVC 12 LTR MEDI- (MISCELLANEOUS) ×4 IMPLANT
CATH INTERMIT  6FR 70CM (CATHETERS) IMPLANT
CATH URET 5FR 28IN CONE TIP (BALLOONS)
CATH URET 5FR 28IN OPEN ENDED (CATHETERS) ×4 IMPLANT
CATH URET 5FR 70CM CONE TIP (BALLOONS) IMPLANT
CATH URET DUAL LUMEN 6-10FR 50 (CATHETERS) IMPLANT
CLOTH BEACON ORANGE TIMEOUT ST (SAFETY) ×4 IMPLANT
DRSG TEGADERM 2-3/8X2-3/4 SM (GAUZE/BANDAGES/DRESSINGS) IMPLANT
FIBER LASER FLEXIVA 200 (UROLOGICAL SUPPLIES) IMPLANT
FIBER LASER FLEXIVA 365 (UROLOGICAL SUPPLIES) IMPLANT
FIBER LASER TRAC TIP (UROLOGICAL SUPPLIES) ×4 IMPLANT
GLOVE BIO SURGEON STRL SZ7.5 (GLOVE) ×4 IMPLANT
GLOVE BIOGEL PI IND STRL 7.5 (GLOVE) ×2 IMPLANT
GLOVE BIOGEL PI INDICATOR 7.5 (GLOVE) ×2
GLOVE SURG SS PI 7.5 STRL IVOR (GLOVE) ×4 IMPLANT
GOWN STRL REUS W/ TWL XL LVL3 (GOWN DISPOSABLE) ×4 IMPLANT
GOWN STRL REUS W/TWL XL LVL3 (GOWN DISPOSABLE) ×4
GUIDEWIRE 0.038 PTFE COATED (WIRE) IMPLANT
GUIDEWIRE ANG ZIPWIRE 038X150 (WIRE) IMPLANT
GUIDEWIRE STR DUAL SENSOR (WIRE) ×8 IMPLANT
IV NS IRRIG 3000ML ARTHROMATIC (IV SOLUTION) ×8 IMPLANT
KIT BALLIN UROMAX 15FX10 (LABEL) IMPLANT
KIT BALLN UROMAX 15FX4 (MISCELLANEOUS) IMPLANT
KIT BALLN UROMAX 26 75X4 (MISCELLANEOUS)
NS IRRIG 500ML POUR BTL (IV SOLUTION) IMPLANT
PACK CYSTO (CUSTOM PROCEDURE TRAY) ×4 IMPLANT
SET HIGH PRES BAL DIL (LABEL)
SHEATH ACCESS URETERAL 38CM (SHEATH) IMPLANT
STENT URET 6FRX24 CONTOUR (STENTS) ×4 IMPLANT
TUBE FEEDING 8FR 16IN STR KANG (MISCELLANEOUS) IMPLANT

## 2014-12-19 NOTE — Transfer of Care (Signed)
Immediate Anesthesia Transfer of Care Note  Patient: Susan Jensen  Procedure(s) Performed: Procedure(s): CYSTOSCOPY WITH STENT REPLACEMENT (Right) CYSTOSCOPY WITH URETEROSCOPY (Right) CYSTOSCOPY WITH RETROGRADE PYELOGRAM (Right) CYSTOSCOPY WITH HOLMIUM LASER LITHOTRIPSY (Right)  Patient Location: PACU  Anesthesia Type:General  Level of Consciousness: awake, alert , oriented and patient cooperative  Airway & Oxygen Therapy: Patient Spontanous Breathing and Patient connected to nasal cannula oxygen  Post-op Assessment: Report given to RN and Post -op Vital signs reviewed and stable  Post vital signs: Reviewed and stable  Last Vitals:  Filed Vitals:   12/19/14 1209  BP: 130/75  Pulse: 89  Temp: 36.6 C  Resp: 16    Complications: No apparent anesthesia complications

## 2014-12-19 NOTE — Anesthesia Preprocedure Evaluation (Signed)
Anesthesia Evaluation  Patient identified by MRN, date of birth, ID band Patient awake    Reviewed: Allergy & Precautions, NPO status , Patient's Chart, lab work & pertinent test results  Airway Mallampati: II  TM Distance: >3 FB Neck ROM: Full    Dental no notable dental hx. (+) Teeth Intact, Dental Advisory Given   Pulmonary asthma , former smoker,  breath sounds clear to auscultation  Pulmonary exam normal       Cardiovascular negative cardio ROS  Rhythm:Regular Rate:Normal     Neuro/Psych negative neurological ROS  negative psych ROS   GI/Hepatic negative GI ROS, Neg liver ROS,   Endo/Other  negative endocrine ROS  Renal/GU negative Renal ROS     Musculoskeletal negative musculoskeletal ROS (+)   Abdominal   Peds  Hematology negative hematology ROS (+)   Anesthesia Other Findings   Reproductive/Obstetrics negative OB ROS                             Anesthesia Physical Anesthesia Plan  ASA: II  Anesthesia Plan: General   Post-op Pain Management:    Induction: Intravenous  Airway Management Planned: LMA  Additional Equipment:   Intra-op Plan:   Post-operative Plan:   Informed Consent:   Plan Discussed with: Surgeon  Anesthesia Plan Comments:         Anesthesia Quick Evaluation

## 2014-12-19 NOTE — Interval H&P Note (Signed)
History and Physical Interval Note: Patient underwent stent placement on 3/22.  She presents today for right ureteroscopy and stent exchange.  No changes since she was last seen.  12/19/2014 5:33 AM  Susan Jensen  has presented today for surgery, with the diagnosis of right ureteral stone  The various methods of treatment have been discussed with the patient and family. After consideration of risks, benefits and other options for treatment, the patient has consented to  Procedure(s): CYSTOSCOPY WITH RIGHT URETEROSCOPY, LASER LITHOTRIPSY AND RIGHT URETERAL STENT EXCHANGE (Right) as a surgical intervention .  The patient's history has been reviewed, patient examined, no change in status, stable for surgery.  I have reviewed the patient's chart and labs.  Questions were answered to the patient's satisfaction.     Berniece SalinesHERRICK, Klarisa Barman W

## 2014-12-19 NOTE — Op Note (Signed)
Preoperative diagnosis: right ureteral calculus  Postoperative diagnosis: right ureteral calculus  Procedure:  1. Cystoscopy 2. right ureteroscopy and stone removal 3. Ureteroscopic laser lithotripsy 4. right 59F x 24cm ureteral stent exchange 5. right retrograde pyelography with interpretation  Surgeon: Crist Fat, MD  Anesthesia: General  Complications: None  Intraoperative findings: right retrograde pyelography demonstrated a filling defect within the right ureter consistent with the patient's known calculus without other abnormalities.  Patient had two stones in the right distal ureter which were removed without fragmentation.  She had a papillary tip calcification within the right mid-pole calyx that I had to laser off the mucosa prior to removal.  EBL: Minimal  Specimens: 1. right ureteral/renal calculus  Disposition of specimens: [facility] for stone analysis  Indication: Susan Jensen is a 34 y.o.   patient with urolithiasis. After reviewing the management options for treatment, the patient elected to proceed with the above surgical procedure(s). We have discussed the potential benefits and risks of the procedure, side effects of the proposed treatment, the likelihood of the patient achieving the goals of the procedure, and any potential problems that might occur during the procedure or recuperation. Informed consent has been obtained.  Description of procedure:  The patient was taken to the operating room and general anesthesia was induced.  The patient was placed in the dorsal lithotomy position, prepped and draped in the usual sterile fashion, and preoperative antibiotics were administered. A preoperative time-out was performed.   Cystourethroscopy was performed.  The patient's urethra was examined and was normal. The bladder was then systematically examined in its entirety. There was no evidence for any bladder tumors, stones, or other mucosal  pathology.  The stent was eminating from the right ureteral orifice.  I then grasp the stent at the distal end using stent graspers and pull the stent down to the urethral meatus.  I then passed 0.38 sensor wire through the stent and into the right renal pelvis, removing the stent over the wire.  A 74F open ended ureteral catheter was then passed over the wire and Omnipaque contrast was injected through the ureteral catheter and a retrograde pyelogram was performed with findings as dictated above.  A 0.38 sensor guidewire was then replaced through the open ended catheter and passed into the renal pelvis under fluoroscopic guidance. The 6 Fr semirigid ureteroscope was then advanced into the ureter next to the guidewire and the calculus was identified.   The stones was then identified in the distal ureter and removed removed from the ureter with an N-gage nitinol basket.  Reinspection of the ureter revealed no remaining visible stones or fragments.   A second wire was then advanced through the scope and the rigid scope removed.  A flexible scope was then passed over the second wire and advanced into the right renal pelvis.  Pyeloscopy was performed.  A stone was noted in the mid-pole calyx which was lasered off the mucosa with a 200 micron fiber with settings of 0.6J and 6 hz.  The stone was then grasped with a basket and removed from the ureter.  There were no additional stones within the kidney.  The scope was then backed out and removed.    The wire was then backloaded through the cystoscope and a ureteral stent was advance over the wire using Seldinger technique.  The stent was positioned appropriately under fluoroscopic and cystoscopic guidance.  The wire was then removed with an adequate stent curl noted in the renal pelvis  as well as in the bladder.  The bladder was then emptied and the procedure ended.  The patient appeared to tolerate the procedure well and without complications.  The patient was  able to be awakened and transferred to the recovery unit in satisfactory condition.   Disposition: The tether of the stent was left on and tucked inside the patient's vagina.  Instructions for removing the stent have been provided to the patient. This has been scheduled for followup in 6 weeks with a renal ultrasound.

## 2014-12-19 NOTE — Anesthesia Procedure Notes (Signed)
Procedure Name: LMA Insertion Date/Time: 12/19/2014 2:25 PM Performed by: Norva PavlovALLAWAY, Naleah Kofoed G Pre-anesthesia Checklist: Patient identified, Emergency Drugs available, Suction available and Patient being monitored Patient Re-evaluated:Patient Re-evaluated prior to inductionOxygen Delivery Method: Circle System Utilized Preoxygenation: Pre-oxygenation with 100% oxygen Intubation Type: IV induction Ventilation: Mask ventilation without difficulty LMA: LMA inserted LMA Size: 4.0 Number of attempts: 1 Airway Equipment and Method: bite block Placement Confirmation: positive ETCO2 Tube secured with: Tape Dental Injury: Teeth and Oropharynx as per pre-operative assessment

## 2014-12-19 NOTE — Anesthesia Postprocedure Evaluation (Signed)
  Anesthesia Post-op Note  Patient: Susan Jensen  Procedure(s) Performed: Procedure(s) (LRB): CYSTOSCOPY WITH STENT REPLACEMENT (Right) CYSTOSCOPY WITH URETEROSCOPY (Right) CYSTOSCOPY WITH RETROGRADE PYELOGRAM (Right) CYSTOSCOPY WITH HOLMIUM LASER LITHOTRIPSY (Right)  Patient Location: PACU  Anesthesia Type: General  Level of Consciousness: awake and alert   Airway and Oxygen Therapy: Patient Spontanous Breathing  Post-op Pain: mild  Post-op Assessment: Post-op Vital signs reviewed, Patient's Cardiovascular Status Stable, Respiratory Function Stable, Patent Airway and No signs of Nausea or vomiting  Last Vitals:  Filed Vitals:   12/19/14 1627  BP: 117/69  Pulse: 73  Temp: 36.5 C  Resp: 18    Post-op Vital Signs: stable   Complications: No apparent anesthesia complications

## 2014-12-19 NOTE — Discharge Instructions (Signed)
DISCHARGE INSTRUCTIONS FOR KIDNEY STONE/URETERAL STENT   MEDICATIONS:  1.  Resume all your other meds from home - except do not take any extra narcotic pain meds that you may have at home.  2. Oxybutynin is to prevent bladder spasms and help reduce urinary frequency. 3. Pyridium is to help with the burning/stinging when you urinate. 4. Vicodin is for moderate/severe pain, otherwise taking upto 1000mg  every 6 hours of plainTylenol will help treat your pain.  Do not take both at the same time. 5. Take Cipro one hour prior to removal of your stent.   ACTIVITY:  1. No strenuous activity x 1week  2. No driving while on narcotic pain medications  3. Drink plenty of water  4. Continue to walk at home - you can still get blood clots when you are at home, so keep active, but don't over do it.  5. May return to work/school tomorrow or when you feel ready   BATHING:  1. You can shower and we recommend daily showers  2. You have a string coming from your urethra: The stent string is attached to your ureteral stent. Do not pull on this.   SIGNS/SYMPTOMS TO CALL:  Please call us if you have a fever greater than 101.5, uncontrolled nausea/vomiting, uncontrolled pain, dizziness, unable to urinate, bloody urine, chest pain, shortness of breath, leg swelling, leg pain, redness around wound, drainage from wound, or any other concerns or questions.   You can reach us at [phone].   FOLLOW-UP:  1. You have an appointment in 6 weeks with a ultrasound of your kidneys prior.   2.  You have a string attached to your stent, you may remove it on  Wednesday April 20th . To do this, pull the strings until the stents are completely removed. You may feel an odd sensation in your back.  Don't forget to take your antibiotic one hour prior. Post Anesthesia Home Care Instructions  Activity: Get plenty of rest for the remainder of the day. A responsible adult should stay with you for 24 hours following the procedure.   For the next 24 hours, DO NOT: -Drive a car -Advertising copywriterperate machinery -Drink alcoholic beverages -Take any medication unless instructed by your physician -Make any legal decisions or sign important papers.  Meals: Start with liquid foods such as gelatin or soup. Progress to regular foods as tolerated. Avoid greasy, spicy, heavy foods. If nausea and/or vomiting occur, drink only clear liquids until the nausea and/or vomiting subsides. Call your physician if vomiting continues.  Special Instructions/Symptoms: Your throat may feel dry or sore from the anesthesia or the breathing tube placed in your throat during surgery. If this causes discomfort, gargle with warm salt water. The discomfort should disappear within 24 hours.

## 2014-12-22 ENCOUNTER — Encounter (HOSPITAL_BASED_OUTPATIENT_CLINIC_OR_DEPARTMENT_OTHER): Payer: Self-pay | Admitting: Urology

## 2015-12-16 IMAGING — CT CT ABD-PELV W/O CM
3 of 4 series · 9 of 46 positions shown, 16 images · non-contrast
Comparison: None.

CLINICAL DATA: Initial evaluation for right severe acute right
flank pain. Nausea.

EXAM:
CT ABDOMEN AND PELVIS WITHOUT CONTRAST
TECHNIQUE: Multidetector CT imaging of the abdomen and pelvis was performed
following the standard protocol without IV contrast.

[Series 3: mpr coronal 3.0mm · coronal · 0.72mm/px · 3 of 102 slices shown, 4 images]
[im 34/102  soft-tissue]
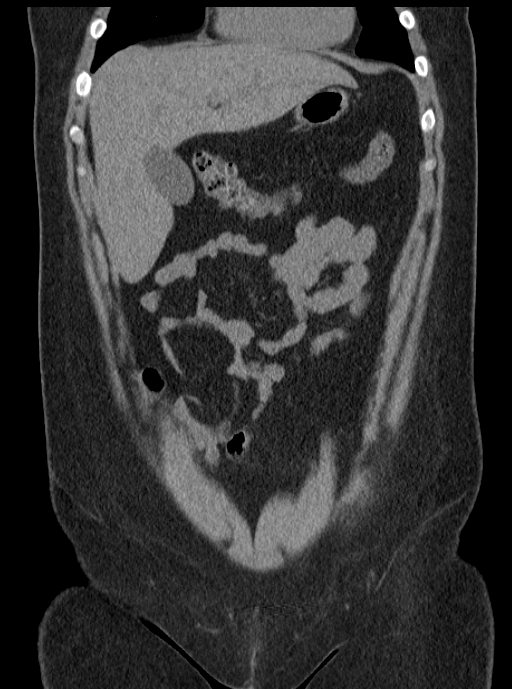
[im 45/102  soft-tissue]
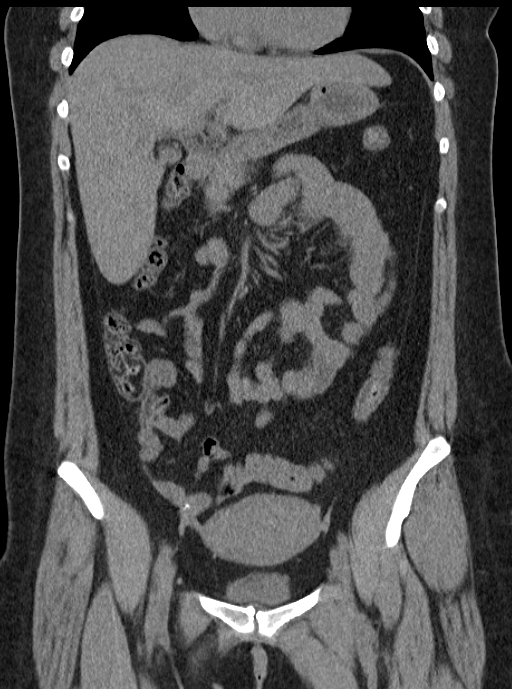
[im 45/102  bone]
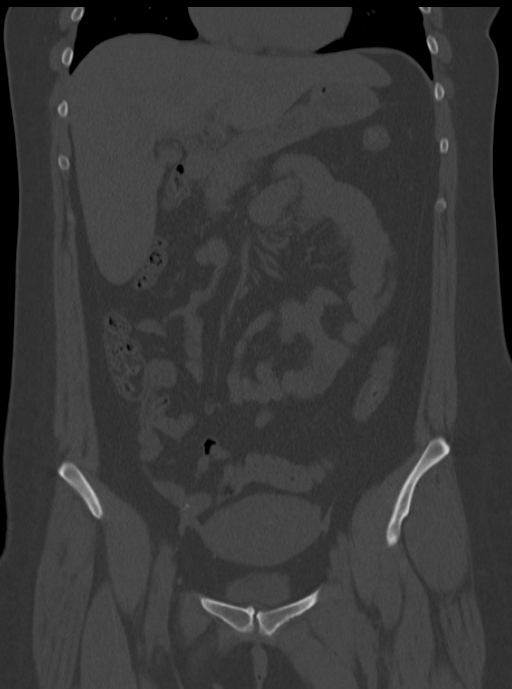
[im 57/102  soft-tissue]
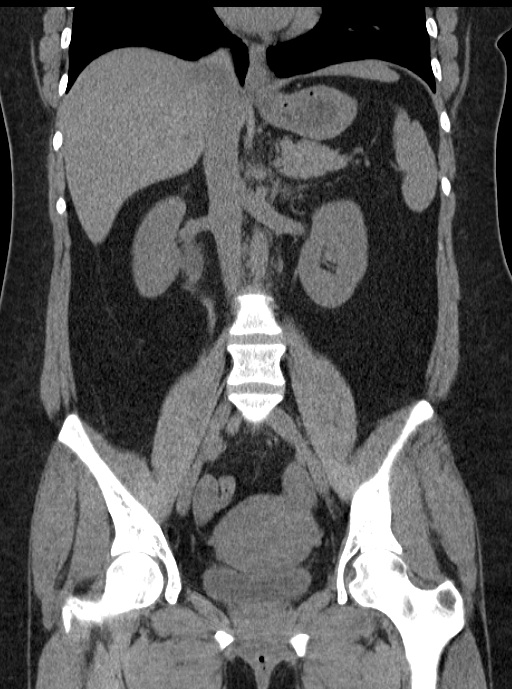

[Series 4: mpr sagittal 3.0mm · sagittal · 0.59mm/px · 1 of 124 slices shown, 2 images]
[im 42/124  soft-tissue]
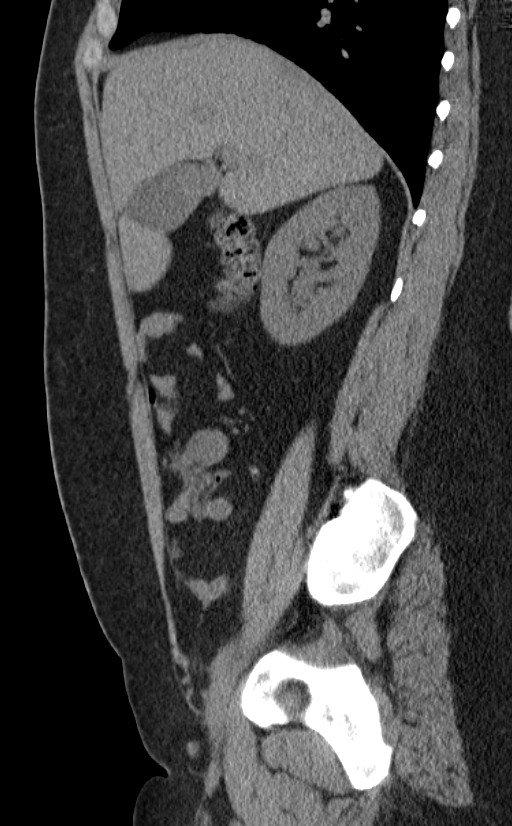
[im 42/124  bone]
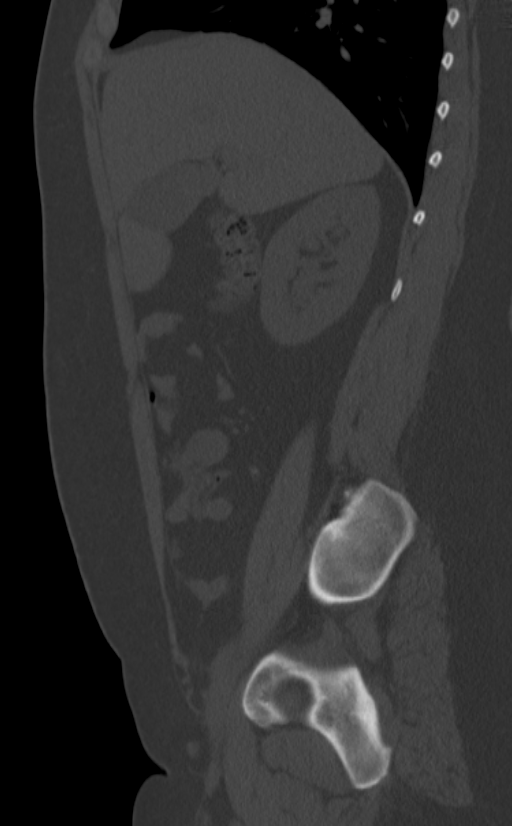

[Series 6: lung 5.0 b60f · axial · 0.67mm/px · z∈[+805,+895]mm · 5 of 28 slices shown, 10 images]
[im 5/28  soft-tissue]
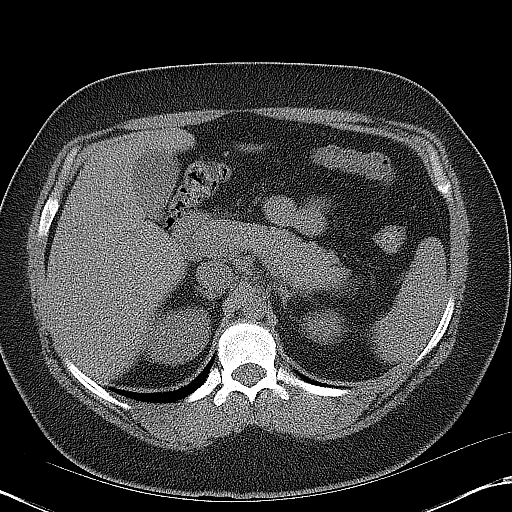
[im 5/28  bone]
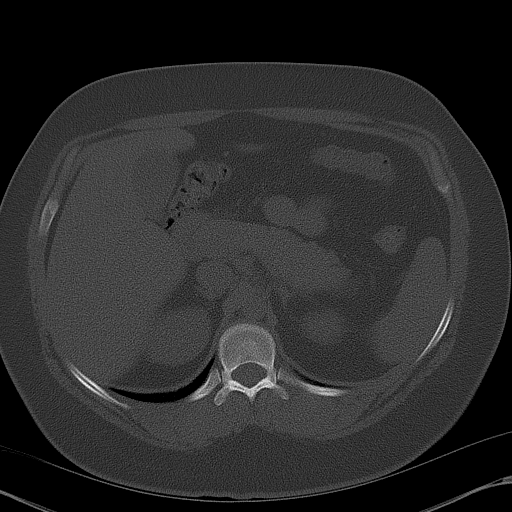
[im 10/28  soft-tissue]
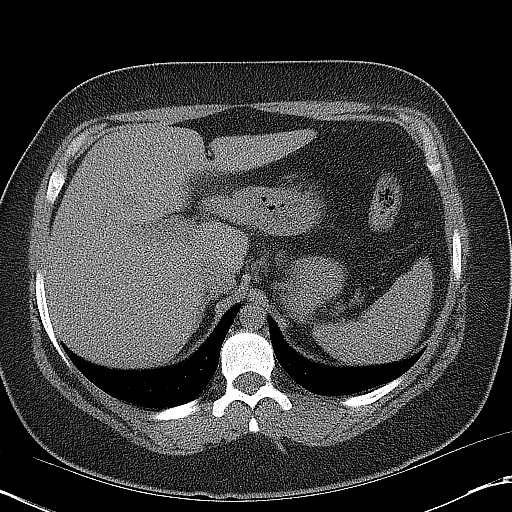
[im 10/28  lung]
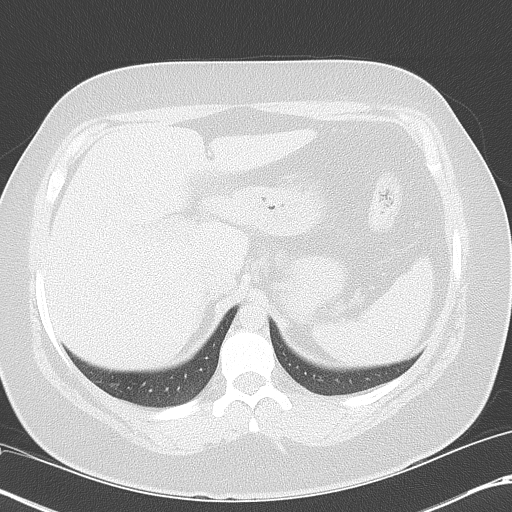
[im 14/28  soft-tissue]
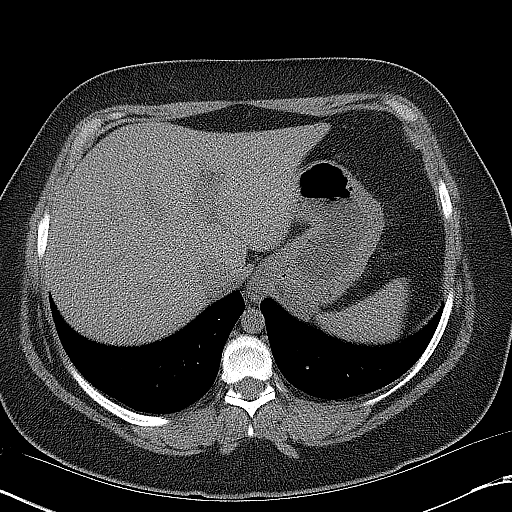
[im 14/28  lung]
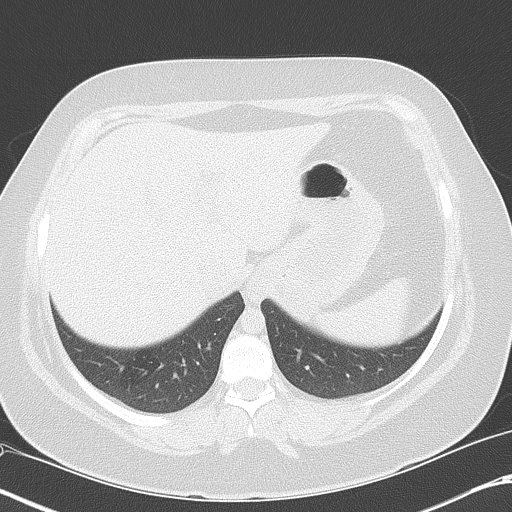
[im 19/28  soft-tissue]
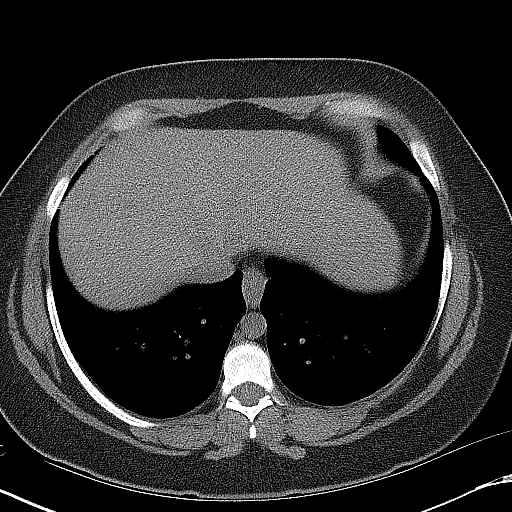
[im 19/28  lung]
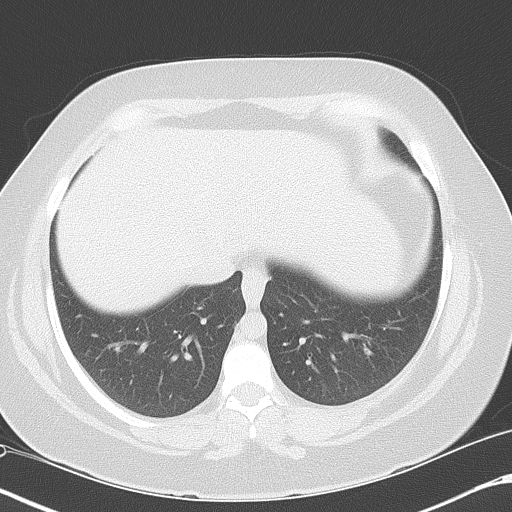
[im 23/28  soft-tissue]
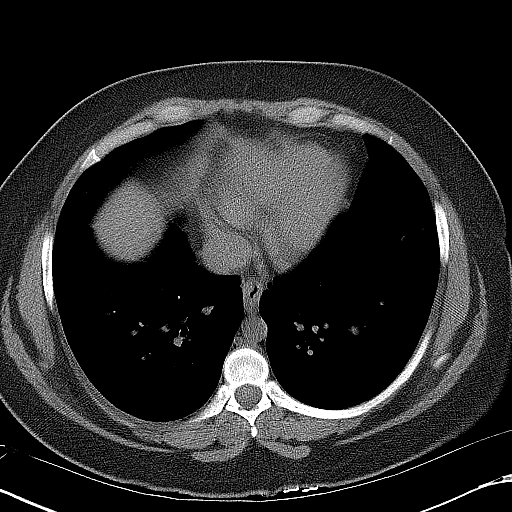
[im 23/28  lung]
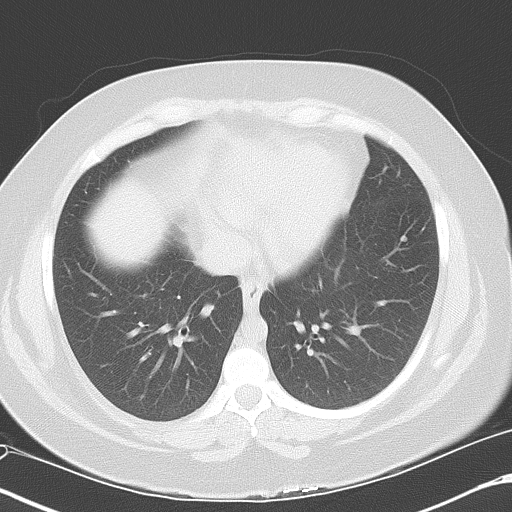

[9 of 46 positions shown; findings below may reference images not displayed]

FINDINGS: The visualized lung bases are clear. No pleural or pericardial
effusion. Incidental note made of a 5 mm nodule within the left
lower lobe (series 6, image 6). Additional 4 mm nodule present
within the right lung base (series 6, image 4).

Limited noncontrast evaluation of the liver is unremarkable.
Gallbladder within normal limits. No biliary dilatation. Spleen,
adrenal glands, and pancreas are within normal limits.

Left kidney unremarkable without evidence for nephrolithiasis or
hydronephrosis.

On the right, there is are 2 adjacent obstructive stones stacked at
the right UPJ, he each measuring approximately 6 mm each adjacent
periureteral fat stranding present. There is secondary moderate
right hydronephrosis. No other stones seen along the course of the
right renal collecting system. Additional nonobstructive 4 mm stone
present within the lower pole of the right kidney.

Stomach within normal limits. No evidence for bowel obstruction. No
acute inflammatory changes seen about the bowels. Appendix normal.

Bladder within normal limits. Uterus and ovaries are normal. Tubal
ligation clips present.

No free air or fluid.  No adenopathy.

No acute osseous abnormality. No worrisome lytic or blastic osseous
lesion.
IMPRESSION: 1. Two adjacent 6 mm obstructive stones stacked on top of each other
near the right UPJ with secondary moderate right hydronephrosis.
2. Additional 4 mm nonobstructive right renal calculus.
3. Subcentimeter nodules measuring up to 5 mm within the partially
visualized lung bases. These are indeterminate. Please note that
Fleischner criteria do not apply in patients of this age.

## 2017-05-30 ENCOUNTER — Encounter: Payer: Self-pay | Admitting: Internal Medicine

## 2017-05-31 ENCOUNTER — Encounter: Payer: Self-pay | Admitting: Gastroenterology

## 2017-05-31 ENCOUNTER — Other Ambulatory Visit (HOSPITAL_COMMUNITY): Payer: Self-pay | Admitting: Preventative Medicine

## 2017-05-31 DIAGNOSIS — R109 Unspecified abdominal pain: Secondary | ICD-10-CM

## 2017-06-05 ENCOUNTER — Other Ambulatory Visit (HOSPITAL_COMMUNITY): Payer: Self-pay | Admitting: Preventative Medicine

## 2017-06-05 ENCOUNTER — Ambulatory Visit (HOSPITAL_COMMUNITY)
Admission: RE | Admit: 2017-06-05 | Discharge: 2017-06-05 | Disposition: A | Discharge status: Home / Self Care | Payer: 59 | Source: Ambulatory Visit | Attending: Preventative Medicine | Admitting: Preventative Medicine

## 2017-06-05 DIAGNOSIS — R109 Unspecified abdominal pain: Secondary | ICD-10-CM

## 2017-06-05 DIAGNOSIS — K5730 Diverticulosis of large intestine without perforation or abscess without bleeding: Secondary | ICD-10-CM | POA: Diagnosis not present

## 2017-06-05 DIAGNOSIS — K439 Ventral hernia without obstruction or gangrene: Secondary | ICD-10-CM | POA: Insufficient documentation

## 2017-06-07 ENCOUNTER — Ambulatory Visit (INDEPENDENT_AMBULATORY_CARE_PROVIDER_SITE_OTHER): Payer: 59 | Admitting: Gastroenterology

## 2017-06-07 ENCOUNTER — Encounter: Payer: Self-pay | Admitting: Gastroenterology

## 2017-06-07 VITALS — BP 126/72 | HR 84 | Ht 64.0 in | Wt 207.0 lb

## 2017-06-07 DIAGNOSIS — R109 Unspecified abdominal pain: Secondary | ICD-10-CM | POA: Diagnosis not present

## 2017-06-07 DIAGNOSIS — K5900 Constipation, unspecified: Secondary | ICD-10-CM | POA: Diagnosis not present

## 2017-06-07 NOTE — Progress Notes (Signed)
06/07/2017 Susan Jensen [id number] 1981-03-25   HISTORY OF PRESENT ILLNESS:  This is a 36 year old female who is new to our office and referred here by her PCP, Dr. Wende Crease, for evaluation of abdominal pain.  She tells me that all of this began about 3 weeks ago.  Describes B/L abdominal pain, almost flank pain, radiating to her back, initially low-grade fever that has now resolved, and some new constipation.  CT scan is negative for any cause of her symptoms (did show diverticulosis but no diverticulitis.  She had a nasty looking UA, but culture was negative.  She has seen urology in the past for kidney stones.  She tells me that at this point her symptoms have significantly improved and she was not sure that she really even needed this appt.  She admits that she had been working 60-120 hours per week, 16 hour days, for a while and has not been drinking anything but caffeine, not eating much or well.  Work schedule will hopefully be getting better soon.  Has been moving her bowels better the past few days.  She tells me that a couple of times at the beginning of this when she was straining that she saw small amounts of bright red blood on the toilet paper.   Past Medical History:  Diagnosis Date  . Asthma    no inhaler  . Nephrolithiasis    right   . Right ureteral stone   . Wears glasses    Past Surgical History:  Procedure Laterality Date  . CYSTOSCOPY W/ RETROGRADES Right 12/19/2014   Procedure: CYSTOSCOPY WITH RETROGRADE PYELOGRAM;  Surgeon: Crist Fat, MD;  Location: [facility];  Service: Urology;  Laterality: Right;  . CYSTOSCOPY W/ URETERAL STENT PLACEMENT Right 12/19/2014   Procedure: CYSTOSCOPY WITH STENT REPLACEMENT;  Surgeon: Crist Fat, MD;  Location: [facility];  Service: Urology;  Laterality: Right;  . CYSTOSCOPY WITH STENT PLACEMENT Right 11/25/2014   Procedure: CYSTOSCOPY, RIGHT RETROGRADE PYELOGRAM WITH STENT PLACEMENT;   Surgeon: Crist Fat, MD;  Location: [facility];  Service: Urology;  Laterality: Right;  . CYSTOSCOPY WITH URETEROSCOPY Right 12/19/2014   Procedure: CYSTOSCOPY WITH URETEROSCOPY;  Surgeon: Crist Fat, MD;  [facility];  Service: Urology;  Laterality: Right;  . KNEE SURGERY Left 83 month old  . TUBAL LIGATION  2006    reports that she quit smoking about 4 years ago. Her smoking use included Cigarettes. She has a 10.00 pack-year smoking history. She has never used smokeless tobacco. She reports that she does not drink alcohol or use drugs. family history includes Breast cancer in her mother; Cancer in her other; Heart disease in her maternal grandfather and mother; Heart failure in her other; Irritable bowel syndrome in her brother; Ovarian cancer in her mother. Allergies  Allergen Reactions  . Ibuprofen Other (See Comments)    "Bleeding thru skin"  . Pertussis Vaccines     Unknown child reaction      Outpatient Encounter Prescriptions as of 06/07/2017  Medication Sig  . omeprazole (PRILOSEC) 40 MG capsule Take 40 mg by mouth daily.  . [DISCONTINUED] ciprofloxacin (CIPRO) 500 MG tablet Take 1 tablet (500 mg total) by mouth once.  . [DISCONTINUED] docusate sodium (COLACE) 100 MG capsule Take 1 capsule (100 mg total) by mouth 2 (two) times daily as needed (take to keep stool soft.).  . [DISCONTINUED] HYDROcodone-acetaminophen (NORCO/VICODIN) 5-325 MG per tablet  Take 1-2 tablets by mouth every 6 (six) hours as needed.  . [DISCONTINUED] naproxen (NAPROSYN) 500 MG tablet Take 1 tablet (500 mg total) by mouth 2 (two) times daily.  . [DISCONTINUED] ondansetron (ZOFRAN ODT) 4 MG disintegrating tablet Take 1 tablet (4 mg total) by mouth every 8 (eight) hours as needed.  . [DISCONTINUED] oxybutynin (DITROPAN) 5 MG tablet Take 1 tablet (5 mg total) by mouth 3 (three) times daily.  . [DISCONTINUED] phenazopyridine (PYRIDIUM) 200 MG tablet Take 1  tablet (200 mg total) by mouth 3 (three) times daily as needed for pain.  . [DISCONTINUED] promethazine (PHENERGAN) 25 MG tablet Take 1 tablet (25 mg total) by mouth every 6 (six) hours as needed.  . [DISCONTINUED] tamsulosin (FLOMAX) 0.4 MG CAPS capsule Take 1 capsule (0.4 mg total) by mouth daily.   No facility-administered encounter medications on file as of 06/07/2017.      REVIEW OF SYSTEMS  : All other systems reviewed and negative except where noted in the History of Present Illness.   PHYSICAL EXAM: BP 126/72   Pulse 84   Ht  (1.626 m)   Wt 207 lb (93.9 kg)   LMP 05/22/2017 Comment: tubal  BMI 35.53 kg/m  General: Well developed white female in no acute distress Head: Normocephalic and atraumatic Eyes:  Sclerae anicteric, conjunctiva pink. Ears: Normal auditory acuity Lungs: Clear throughout to auscultation; no increased WOB. Heart: Regular rate and rhythm; no M/R/G. Abdomen: Soft, non-distended.  BS present.  Mild TTP on B/L mid-abdomen, almost flanks. Musculoskeletal: Symmetrical with no gross deformities  Skin: No lesions on visible extremities Extremities: No edema  Neurological: Alert oriented x 4, grossly non-focal Psychological:  Alert and cooperative. Normal mood and affect  ASSESSMENT AND PLAN: *36 year old female with 3 week history of B/L abdominal pain radiating to her back, initially low-grade fever, and some new constipation.  CT scan is negative.  She had a nasty looking UA, but culture was negative.  She has seen urology in the past for kidney stones.  I am not sure exactly what is causing her abdominal pain.  ? Non-GI, possibly urologic.  She admits that she had been working 60-120 hours per week, 16 hour days, for a while and has not been drinking anything but caffeine, not eating much or well.  I think that this all likely caused her recent issue with constipation.  Both her pain and her constipation have improved/are resolving.  I do not think that  there is any severe issue going on here.  I am going to recheck a UA.  I discussed observation for now and she is in complete agreement with this.  She says that her work schedule will hopefully be getting better, which hopefully will help.  She will call back if symptoms persist.  If UA still abnormal then may need to go back and see urology.   CC:  Laverle Hobby, MD

## 2017-06-07 NOTE — Patient Instructions (Signed)
If you are age 34 or older, your body mass index should be between 23-30. Your Body mass index is 35.53 kg/m. If this is out of the aforementioned range listed, please consider follow up with your Primary Care Provider.  If you are age 16 or younger, your body mass index should be between 19-25. Your Body mass index is 35.53 kg/m. If this is out of the aformentioned range listed, please consider follow up with your Primary Care Provider.   Your physician has requested that you go to the basement for the following lab work before leaving today:  Urinalysis  Thank you.

## 2017-06-08 ENCOUNTER — Encounter: Payer: Self-pay | Admitting: Gastroenterology

## 2017-06-08 DIAGNOSIS — K5900 Constipation, unspecified: Secondary | ICD-10-CM | POA: Insufficient documentation

## 2017-06-08 DIAGNOSIS — R109 Unspecified abdominal pain: Secondary | ICD-10-CM | POA: Insufficient documentation

## 2017-06-08 NOTE — Progress Notes (Signed)
GI physician assistant Assessment and plans reviewed

## 2017-07-17 ENCOUNTER — Ambulatory Visit: Payer: 59 | Admitting: Gastroenterology

## 2018-06-27 IMAGING — CT CT RENAL STONE PROTOCOL
2 of 4 series · 16 of 46 positions shown, 18 images · non-contrast
Comparison: November 24, 2014

CLINICAL DATA: Bilateral flank pain with constipation and blood in
stool

EXAM:
CT ABDOMEN AND PELVIS WITHOUT CONTRAST
TECHNIQUE: Multidetector CT imaging of the abdomen and pelvis was performed
following the standard protocol without oral or intravenous contrast
material administration.

[Series 2: axial st · axial · 0.78mm/px · z∈[+742,+1172]mm · 13 of 94 slices shown, 15 images]
[im 4/94  soft-tissue]
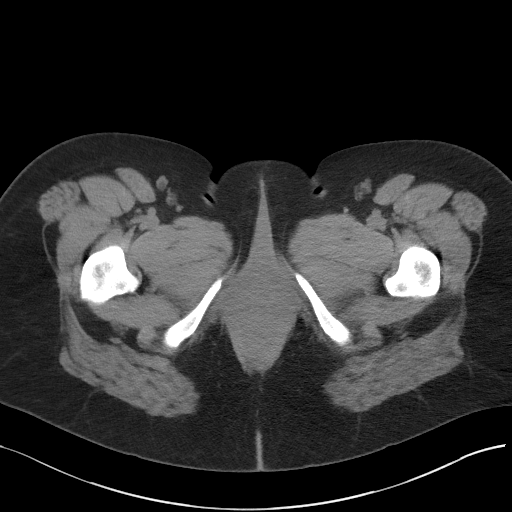
[im 4/94  bone]
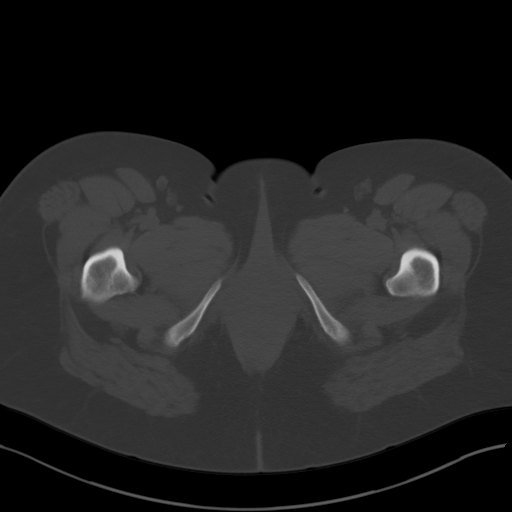
[im 12/94  soft-tissue]
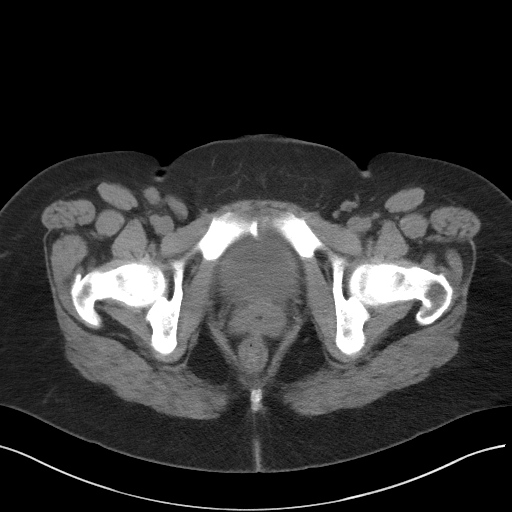
[im 20/94  soft-tissue]
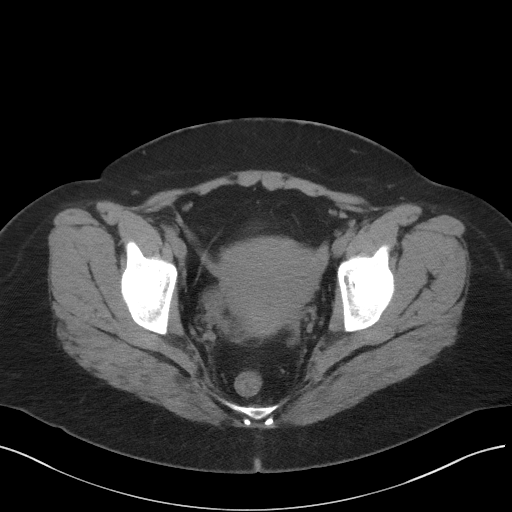
[im 28/94  soft-tissue]
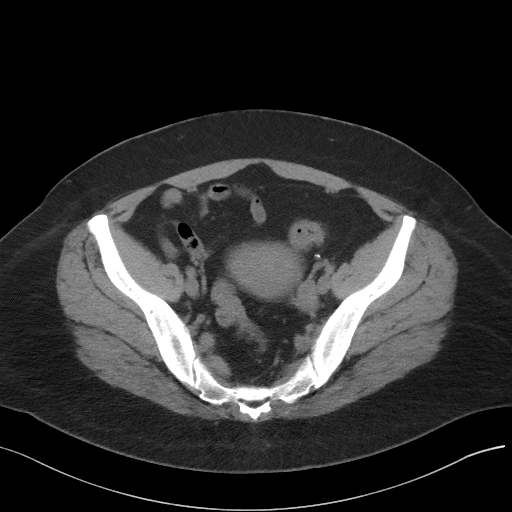
[im 32/94  soft-tissue]
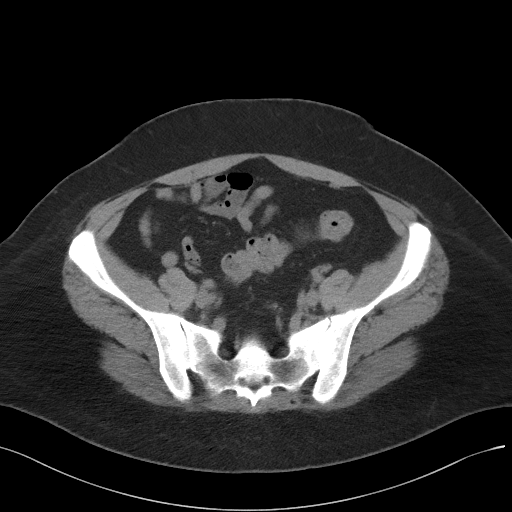
[im 39/94  soft-tissue]
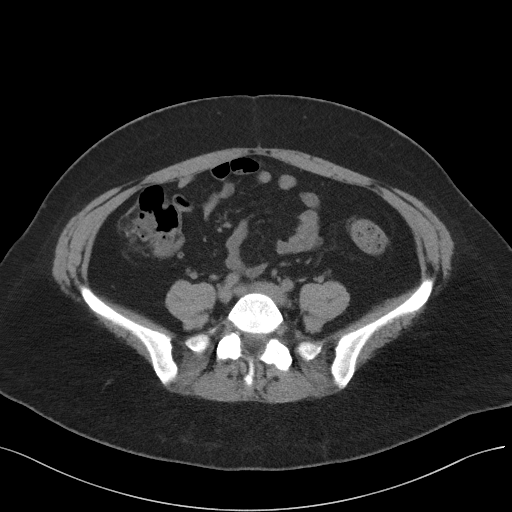
[im 47/94  soft-tissue]
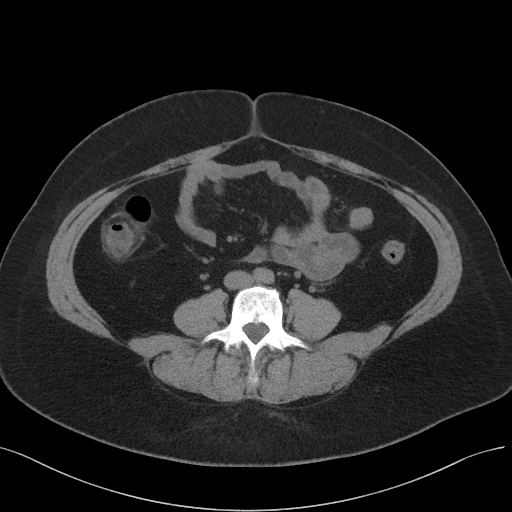
[im 55/94  soft-tissue]
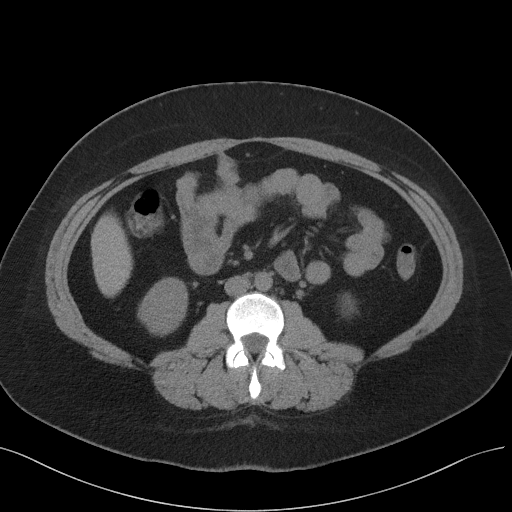
[im 63/94  soft-tissue]
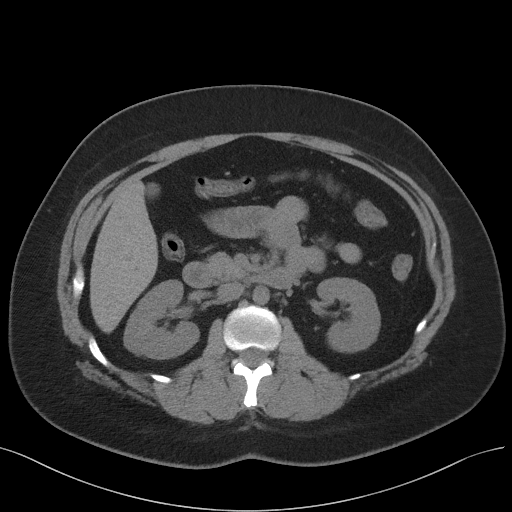
[im 63/94  bone]
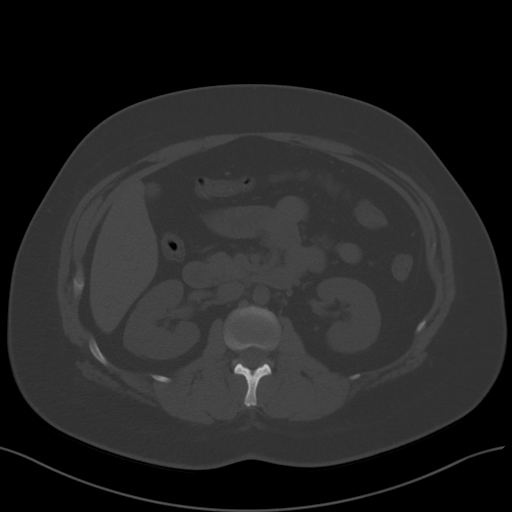
[im 66/94  soft-tissue]
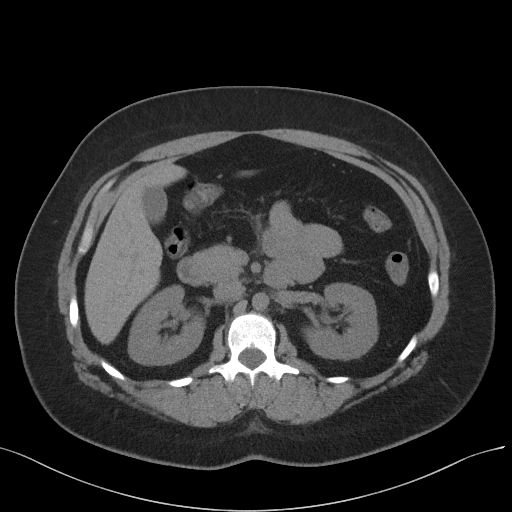
[im 74/94  soft-tissue]
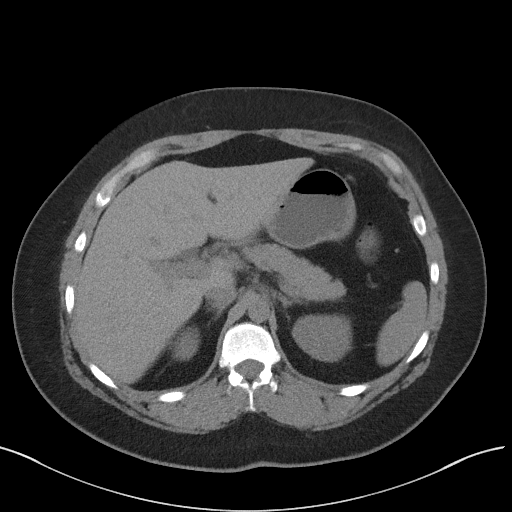
[im 82/94  soft-tissue]
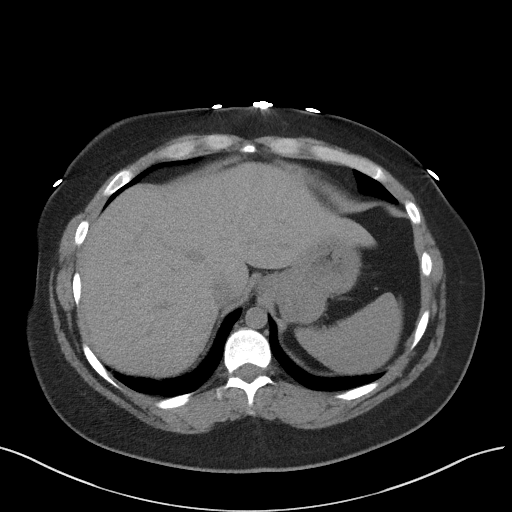
[im 90/94  soft-tissue]
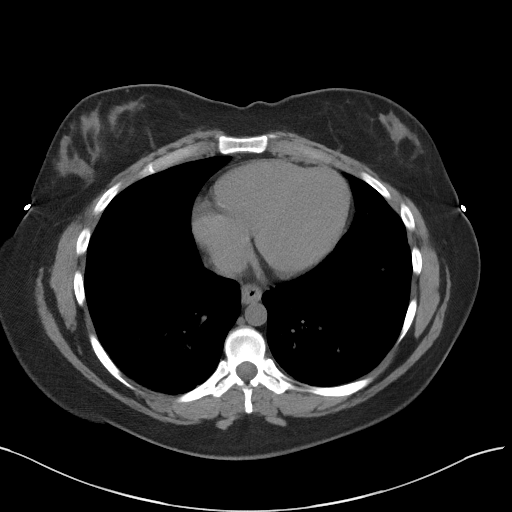

[Series 5: coronal st · coronal · 0.79mm/px · 3 of 100 slices shown]
[im 34/100  soft-tissue]
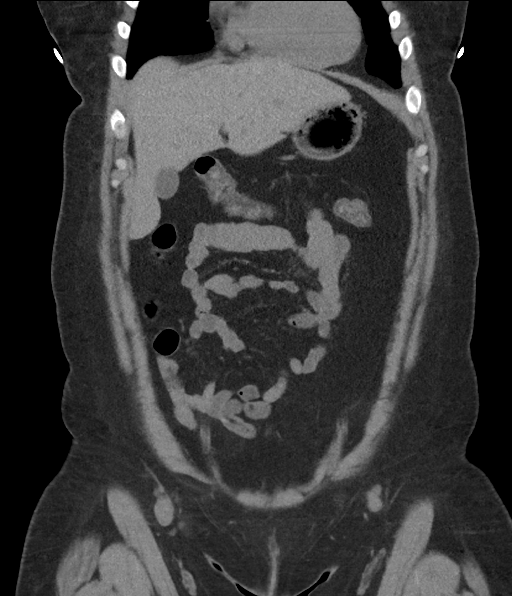
[im 45/100  soft-tissue]
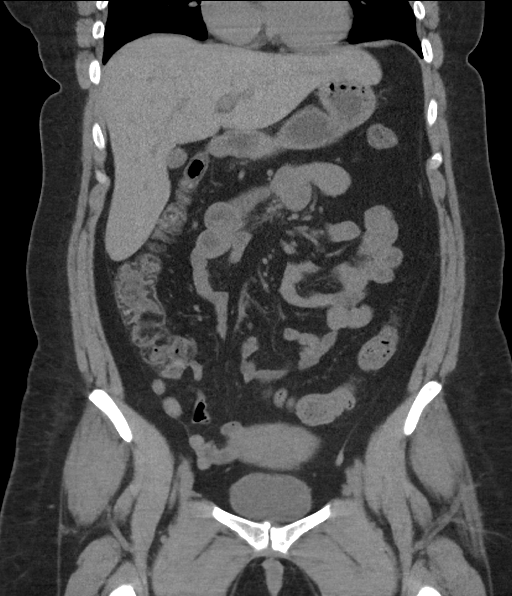
[im 56/100  soft-tissue]
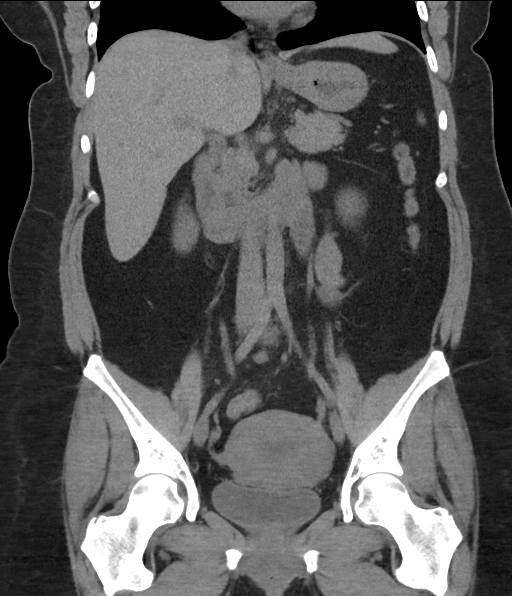

[16 of 46 positions shown; findings below may reference images not displayed]

FINDINGS: Lower chest: There is bibasilar atelectatic change.

Hepatobiliary: No focal liver lesions are appreciable on this
noncontrast enhanced study. Gallbladder wall is not appreciably
thickened. There is no biliary duct dilatation.

Pancreas: There is no pancreatic mass or inflammatory focus.

Spleen: No splenic lesions are evident.

Adrenals/Urinary Tract: Adrenals bilaterally appear unremarkable.
Kidneys bilaterally show no evident mass or hydronephrosis on either
side. There is no renal or ureteral calculus on either side. Urinary
bladder is midline with wall thickness within normal limits.

Stomach/Bowel: There are scattered sigmoid diverticula without
diverticulitis. There is no appreciable bowel wall or mesenteric
thickening. There is no evident bowel obstruction. No free air or
portal venous air. There is lipomatous infiltration of the ileocecal
valve.

Vascular/Lymphatic: There is no abdominal aortic aneurysm. No
vascular lesions are evident on this noncontrast enhanced study.
There is no appreciable adenopathy in the abdomen or pelvis.

Reproductive: Uterus is anteverted. There is no pelvic mass beyond a
1.5 x 1.5 cm cyst arising eccentrically from the right ovary, a
likely somewhat eccentric in location dominant follicle. There are
tubal ligation clips bilaterally.

Other: Appendix appears normal. There is no ascites or abscess in
the abdomen or pelvis. There is a small ventral hernia containing
only fat.

Musculoskeletal: There are no blastic or lytic bone lesions. There
is no intramuscular or abdominal wall lesion.
IMPRESSION: 1. There are scattered sigmoid diverticula without diverticulitis.
No bowel wall thickening or bowel obstruction. No abscess. A cause
for blood in stool has not been established with this study. After
appropriate bowel preparation, direct visualization of the colon or
barium enema examination may well be warranted.

2. No renal or ureteral calculus. No hydronephrosis. Urinary bladder
wall thickness is normal.

3. Tubal ligation clips in the fallopian tube regions are noted
bilaterally.

4.  Small ventral hernia containing only fat.

## 2021-09-20 DIAGNOSIS — J069 Acute upper respiratory infection, unspecified: Secondary | ICD-10-CM | POA: Diagnosis not present

## 2021-10-12 ENCOUNTER — Encounter: Payer: Self-pay | Admitting: Internal Medicine

## 2021-10-12 ENCOUNTER — Other Ambulatory Visit: Payer: Self-pay

## 2021-10-12 ENCOUNTER — Ambulatory Visit (INDEPENDENT_AMBULATORY_CARE_PROVIDER_SITE_OTHER): Payer: BC Managed Care – PPO | Admitting: Internal Medicine

## 2021-10-12 VITALS — BP 114/70 | HR 79 | Temp 97.8°F | Resp 16 | Ht 64.0 in | Wt 218.6 lb

## 2021-10-12 DIAGNOSIS — R42 Dizziness and giddiness: Secondary | ICD-10-CM | POA: Diagnosis not present

## 2021-10-12 DIAGNOSIS — R5383 Other fatigue: Secondary | ICD-10-CM | POA: Insufficient documentation

## 2021-10-12 DIAGNOSIS — N926 Irregular menstruation, unspecified: Secondary | ICD-10-CM | POA: Diagnosis not present

## 2021-10-12 DIAGNOSIS — Z13220 Encounter for screening for lipoid disorders: Secondary | ICD-10-CM | POA: Diagnosis not present

## 2021-10-12 NOTE — Progress Notes (Signed)
Patient ID: Nelda Marseillerisha Cheatum, female   DOB: August 12, 1981, 41 y.o.   MRN: [id number]   Subjective:    Patient ID: Nelda Marseillerisha Shed, female    DOB: August 12, 1981, 41 y.o.   MRN: [id number]  This visit occurred during the SARS-CoV-2 public health emergency.  Safety protocols were in place, including screening questions prior to the visit, additional usage of staff PPE, and extensive cleaning of exam room while observing appropriate contact time as indicated for disinfecting solutions.   Patient here to establish care.   Chief Complaint  Patient presents with   Establish Care   .   HPI Has had issues with vertigo.  Persistent intermittent symptoms.  Some nausea.  Has noticed - question of monthly flare.  Questioning if related to menstrual cycle.  Has tried modified epley maneuver.  Certain movements or position changes aggravate.  Is now working an office job.  Reports decreased stress.  Has a history of exercise induced asthma.  Has some issues with sinus/allergies.  Breathing currently relatively stable.  No increased cough and congestion.  Previously smoked - 1/2 ppd.  Smoked since age 41.  Quit two years ago.  Occasional beer.  No binge drinking.  History of BTL.  Has heavy irregular periods.  Increased pain and cramping. Has to be out of work one day per month.  Some dizziness and nausea associated.  No increased abdominal pain or bowel change reported.  Has three children - two daughters ages 3524 and 6718 and a son age 39.     Past Medical History:  Diagnosis Date   Arthritis    Asthma    exercise induced   Nephrolithiasis    right    Right ureteral stone    Seizures (HCC)    Vertigo    Wears glasses    Past Surgical History:  Procedure Laterality Date   CYSTOSCOPY W/ RETROGRADES Right 12/19/2014   Procedure: CYSTOSCOPY WITH RETROGRADE PYELOGRAM;  Surgeon: Crist FatBenjamin W Herrick, MD;  [facility] [place];  Service: Urology;  Laterality: Right;   CYSTOSCOPY W/ URETERAL STENT  PLACEMENT Right 12/19/2014   Procedure: CYSTOSCOPY WITH STENT REPLACEMENT;  Surgeon: Crist FatBenjamin W Herrick, MD;  [facility] [place];  Service: Urology;  Laterality: Right;   CYSTOSCOPY WITH STENT PLACEMENT Right 11/25/2014   Procedure: CYSTOSCOPY, RIGHT RETROGRADE PYELOGRAM WITH STENT PLACEMENT;  Surgeon: Crist FatBenjamin W Herrick, MD;  [facility] [place];  Service: Urology;  Laterality: Right;   CYSTOSCOPY WITH URETEROSCOPY Right 12/19/2014   Procedure: CYSTOSCOPY WITH URETEROSCOPY;  Surgeon: Crist FatBenjamin W Herrick, MD;  [facility] [place];  Service: Urology;  Laterality: Right;   KNEE SURGERY Left 8018 month old   TUBAL LIGATION  2006   Family History  Problem Relation Age of Onset   Hypertension Mother    Breast cancer Mother    Ovarian cancer Mother    Heart disease Mother    Drug abuse Father    Depression Father    Cancer Father    Arthritis Father    Alcohol abuse Father    Hypertension Brother    Hyperlipidemia Brother    Irritable bowel syndrome Brother    Asthma Brother    Hyperlipidemia Brother    Hypertension Brother    Heart disease Maternal Grandfather    Cancer Other    Heart failure Other    Social History   Socioeconomic History   Marital status: Divorced    Spouse name: Not on file  Number of children: Not on file   Years of education: Not on file   Highest education level: Not on file  Occupational History   Not on file  Tobacco Use   Smoking status: Former    Packs/day: 1.00    Years: 10.00    Pack years: 10.00    Types: Cigarettes   Smokeless tobacco: Never  Substance and Sexual Activity   Alcohol use: Yes   Drug use: No   Sexual activity: Yes    Birth control/protection: Surgical  Other Topics Concern   Not on file  Social History Narrative   Not on file   Social Determinants of Health   Financial Resource Strain: Not on file  Food Insecurity: Not on file  Transportation Needs: Not on file   Physical Activity: Not on file  Stress: Not on file  Social Connections: Not on file     Review of Systems  Constitutional:  Negative for appetite change and unexpected weight change.  HENT:  Negative for congestion and sinus pressure.   Respiratory:  Negative for cough, chest tightness and shortness of breath.   Cardiovascular:  Negative for chest pain, palpitations and leg swelling.  Gastrointestinal:  Negative for abdominal pain, diarrhea and vomiting.       Occasional nausea as outlined.    Genitourinary:  Negative for difficulty urinating and dysuria.  Musculoskeletal:  Negative for joint swelling and myalgias.  Skin:  Negative for color change and rash.  Neurological:  Positive for dizziness and light-headedness. Negative for headaches.  Psychiatric/Behavioral:  Negative for agitation and dysphoric mood.       Objective:     BP 114/70    Pulse 79    Temp 97.8 F (36.6 C)    Resp 16    Ht 5\' 4"  (1.626 m)    Wt 218 lb 9.6 oz (99.2 kg)    SpO2 99%    BMI 37.52 kg/m  Wt Readings from Last 3 Encounters:  10/12/21 218 lb 9.6 oz (99.2 kg)  06/07/17 207 lb (93.9 kg)  12/19/14 206 lb (93.4 kg)    Physical Exam Vitals reviewed.  Constitutional:      General: She is not in acute distress.    Appearance: Normal appearance.  HENT:     Head: Normocephalic and atraumatic.     Right Ear: External ear normal.     Left Ear: External ear normal.  Eyes:     General: No scleral icterus.       Right eye: No discharge.        Left eye: No discharge.     Conjunctiva/sclera: Conjunctivae normal.  Neck:     Thyroid: No thyromegaly.  Cardiovascular:     Rate and Rhythm: Normal rate and regular rhythm.  Pulmonary:     Effort: No respiratory distress.     Breath sounds: Normal breath sounds. No wheezing.  Abdominal:     General: Bowel sounds are normal.     Palpations: Abdomen is soft.     Tenderness: There is no abdominal tenderness.  Musculoskeletal:        General: No swelling  or tenderness.     Cervical back: Neck supple. No tenderness.  Lymphadenopathy:     Cervical: No cervical adenopathy.  Skin:    Findings: No erythema or rash.  Neurological:     Mental Status: She is alert.  Psychiatric:        Mood and Affect: Mood normal.  Behavior: Behavior normal.     Outpatient Encounter Medications as of 10/12/2021  Medication Sig   [DISCONTINUED] omeprazole (PRILOSEC) 40 MG capsule Take 40 mg by mouth daily.   No facility-administered encounter medications on file as of 10/12/2021.     Lab Results  Component Value Date   WBC 9.9 10/12/2021   HGB 12.9 10/12/2021   HCT 39.3 10/12/2021   PLT 379.0 10/12/2021   GLUCOSE 80 10/12/2021   CHOL 221 (H) 10/12/2021   TRIG 175.0 (H) 10/12/2021   HDL 38.00 (L) 10/12/2021   LDLCALC 148 (H) 10/12/2021   ALT 13 10/12/2021   AST 11 10/12/2021   NA 141 10/12/2021   K 4.3 10/12/2021   CL 106 10/12/2021   CREATININE 0.72 10/12/2021   BUN 11 10/12/2021   CO2 23 10/12/2021   TSH 2.34 10/12/2021    CT RENAL STONE STUDY  Result Date: 06/05/2017 CLINICAL DATA:  Bilateral flank pain with constipation and blood in stool EXAM: CT ABDOMEN AND PELVIS WITHOUT CONTRAST TECHNIQUE: Multidetector CT imaging of the abdomen and pelvis was performed following the standard protocol without oral or intravenous contrast material administration. COMPARISON:  November 24, 2014 FINDINGS: Lower chest: There is bibasilar atelectatic change. Hepatobiliary: No focal liver lesions are appreciable on this noncontrast enhanced study. Gallbladder wall is not appreciably thickened. There is no biliary duct dilatation. Pancreas: There is no pancreatic mass or inflammatory focus. Spleen: No splenic lesions are evident. Adrenals/Urinary Tract: Adrenals bilaterally appear unremarkable. Kidneys bilaterally show no evident mass or hydronephrosis on either side. There is no renal or ureteral calculus on either side. Urinary bladder is midline with wall  thickness within normal limits. Stomach/Bowel: There are scattered sigmoid diverticula without diverticulitis. There is no appreciable bowel wall or mesenteric thickening. There is no evident bowel obstruction. No free air or portal venous air. There is lipomatous infiltration of the ileocecal valve. Vascular/Lymphatic: There is no abdominal aortic aneurysm. No vascular lesions are evident on this noncontrast enhanced study. There is no appreciable adenopathy in the abdomen or pelvis. Reproductive: Uterus is anteverted. There is no pelvic mass beyond a 1.5 x 1.5 cm cyst arising eccentrically from the right ovary, a likely somewhat eccentric in location dominant follicle. There are tubal ligation clips bilaterally. Other: Appendix appears normal. There is no ascites or abscess in the abdomen or pelvis. There is a small ventral hernia containing only fat. Musculoskeletal: There are no blastic or lytic bone lesions. There is no intramuscular or abdominal wall lesion. IMPRESSION: 1. There are scattered sigmoid diverticula without diverticulitis. No bowel wall thickening or bowel obstruction. No abscess. A cause for blood in stool has not been established with this study. After appropriate bowel preparation, direct visualization of the colon or barium enema examination may well be warranted. 2. No renal or ureteral calculus. No hydronephrosis. Urinary bladder wall thickness is normal. 3. Tubal ligation clips in the fallopian tube regions are noted bilaterally. 4.  Small ventral hernia containing only fat. Electronically Signed   By: Bretta Bang III M.D.   On: 06/05/2017 09:54       Assessment & Plan:   Problem List Items Addressed This Visit     Fatigue    May be multifactorial.  Check labs as outlined.  Confirm not anemic.        Relevant Orders   CBC with Differential/Platelet (Completed)   Comprehensive metabolic panel (Completed)   Ferritin (Completed)   Menstrual changes    Periods as  outlined.  Increased pain and bleeding.  History of BTL.  Check cbc and routine labs.  Follow.       Relevant Orders   TSH (Completed)   Vertigo    History of vertigo as outlined.  No increased sinus pressure currently.  Persistent intermittent symptoms.  Aggravated by quick movements.  Has tried modified epley maneuvers.  Given persistent issues, refer to ENT for further evaluation.        Other Visit Diagnoses     Screening cholesterol level    -  Primary   Relevant Orders   Lipid panel (Completed)        Dale Tabor, MD

## 2021-10-13 LAB — COMPREHENSIVE METABOLIC PANEL
ALT: 13 U/L (ref 0–35)
AST: 11 U/L (ref 0–37)
Albumin: 4.1 g/dL (ref 3.5–5.2)
Alkaline Phosphatase: 55 U/L (ref 39–117)
BUN: 11 mg/dL (ref 6–23)
CO2: 23 mEq/L (ref 19–32)
Calcium: 9.6 mg/dL (ref 8.4–10.5)
Chloride: 106 mEq/L (ref 96–112)
Creatinine, Ser: 0.72 mg/dL (ref 0.40–1.20)
GFR: 106.02 mL/min (ref >60.00)
Glucose, Bld: 80 mg/dL (ref 70–99)
Potassium: 4.3 mEq/L (ref 3.5–5.1)
Sodium: 141 mEq/L (ref 135–145)
Total Bilirubin: 0.3 mg/dL (ref 0.2–1.2)
Total Protein: 6.8 g/dL (ref 6.0–8.3)

## 2021-10-13 LAB — CBC WITH DIFFERENTIAL/PLATELET
Basophils Absolute: 0.1 10*3/uL (ref 0.0–0.1)
Basophils Relative: 1.1 % (ref 0.0–3.0)
Eosinophils Absolute: 0.4 10*3/uL (ref 0.0–0.7)
Eosinophils Relative: 3.5 % (ref 0.0–5.0)
HCT: 39.3 % (ref 36.0–46.0)
Hemoglobin: 12.9 g/dL (ref 12.0–15.0)
Lymphocytes Relative: 39.1 % (ref 12.0–46.0)
Lymphs Abs: 3.9 10*3/uL (ref 0.7–4.0)
MCHC: 32.9 g/dL (ref 30.0–36.0)
MCV: 87.4 fl (ref 78.0–100.0)
Monocytes Absolute: 0.8 10*3/uL (ref 0.1–1.0)
Monocytes Relative: 8.2 % (ref 3.0–12.0)
Neutro Abs: 4.8 10*3/uL (ref 1.4–7.7)
Neutrophils Relative %: 48.1 % (ref 43.0–77.0)
Platelets: 379 10*3/uL (ref 150.0–400.0)
RBC: 4.5 Mil/uL (ref 3.87–5.11)
RDW: 14.4 % (ref 11.5–15.5)
WBC: 9.9 10*3/uL (ref 4.0–10.5)

## 2021-10-13 LAB — LIPID PANEL
Cholesterol: 221 mg/dL — ABNORMAL HIGH (ref 0–200)
HDL: 38 mg/dL — ABNORMAL LOW (ref >39.00)
LDL Cholesterol: 148 mg/dL — ABNORMAL HIGH (ref 0–99)
NonHDL: 183.11
Total CHOL/HDL Ratio: 6
Triglycerides: 175 mg/dL — ABNORMAL HIGH (ref 0.0–149.0)
VLDL: 35 mg/dL (ref 0.0–40.0)

## 2021-10-13 LAB — TSH: TSH: 2.34 u[IU]/mL (ref 0.35–5.50)

## 2021-10-13 LAB — FERRITIN: Ferritin: 12.1 ng/mL (ref 10.0–291.0)

## 2021-10-17 ENCOUNTER — Encounter: Payer: Self-pay | Admitting: Internal Medicine

## 2021-10-17 NOTE — Assessment & Plan Note (Signed)
Periods as outlined.  Increased pain and bleeding.  History of BTL.  Check cbc and routine labs.  Follow.

## 2021-10-17 NOTE — Assessment & Plan Note (Signed)
History of vertigo as outlined.  No increased sinus pressure currently.  Persistent intermittent symptoms.  Aggravated by quick movements.  Has tried modified epley maneuvers.  Given persistent issues, refer to ENT for further evaluation.

## 2021-10-17 NOTE — Assessment & Plan Note (Signed)
May be multifactorial.  Check labs as outlined.  Confirm not anemic.

## 2022-01-04 ENCOUNTER — Encounter: Payer: BC Managed Care – PPO | Admitting: Internal Medicine

## 2022-01-13 ENCOUNTER — Encounter: Payer: BC Managed Care – PPO | Admitting: Internal Medicine

## 2023-05-16 ENCOUNTER — Other Ambulatory Visit: Payer: Self-pay

## 2023-05-16 ENCOUNTER — Ambulatory Visit
Admission: EM | Admit: 2023-05-16 | Discharge: 2023-05-16 | Disposition: A | Discharge status: Home / Self Care | Payer: BC Managed Care – PPO | Attending: Family Medicine | Admitting: Family Medicine

## 2023-05-16 DIAGNOSIS — J0110 Acute frontal sinusitis, unspecified: Secondary | ICD-10-CM

## 2023-05-16 DIAGNOSIS — H6692 Otitis media, unspecified, left ear: Secondary | ICD-10-CM | POA: Diagnosis not present

## 2023-05-16 MED ORDER — AMOXICILLIN-POT CLAVULANATE 875-125 MG PO TABS: 1.0000 | ORAL_TABLET | Freq: Two times a day (BID) | ORAL | 0 refills | Status: AC

## 2023-05-16 MED ORDER — PREDNISONE 20 MG PO TABS: 20.0000 mg | ORAL_TABLET | Freq: Two times a day (BID) | ORAL | 0 refills | Status: AC

## 2023-05-16 NOTE — ED Triage Notes (Addendum)
Sinus and ear pain since Friday, no fever

## 2023-05-16 NOTE — Discharge Instructions (Signed)
Take antibiotic 2 times a day Take prednisone 2 times a day with food Drink lots of water May continue your allergy medicines Call for problems

## 2023-05-16 NOTE — ED Provider Notes (Signed)
[facility]    CSN: [id number] Arrival date & time: 05/16/23  1425      History   Chief Complaint Chief Complaint  Patient presents with   Otalgia   Facial Pain    HPI Susan Jensen is a 42 y.o. female.   HPI  5 days of sinus congestion.  Nasal congestion.  Scant PND.  Mild sore throat.  Following glands.  Now has pain in both ears.  Past Medical History:  Diagnosis Date   Arthritis    Asthma    exercise induced   Nephrolithiasis    right    Right ureteral stone    Seizures (HCC)    Vertigo    Wears glasses     Patient Active Problem List   Diagnosis Date Noted   Menstrual changes 10/12/2021   Fatigue 10/12/2021   Vertigo 10/12/2021   Bilateral flank pain 06/08/2017   Constipation 06/08/2017    Past Surgical History:  Procedure Laterality Date   CYSTOSCOPY W/ RETROGRADES Right 12/19/2014   Procedure: CYSTOSCOPY WITH RETROGRADE PYELOGRAM;  Surgeon: Crist Fat, MD;  Location: [facility];  Service: Urology;  Laterality: Right;   CYSTOSCOPY W/ URETERAL STENT PLACEMENT Right 12/19/2014   Procedure: CYSTOSCOPY WITH STENT REPLACEMENT;  Surgeon: Crist Fat, MD;  Location[facility];  Service: Urology;  Laterality: Right;   CYSTOSCOPY WITH STENT PLACEMENT Right 11/25/2014   Procedure: CYSTOSCOPY, RIGHT RETROGRADE PYELOGRAM WITH STENT PLACEMENT;  Surgeon: Crist Fat, MD;  Location: [facility];  Service: Urology;  Laterality: Right;   CYSTOSCOPY WITH URETEROSCOPY Right 12/19/2014   Procedure: CYSTOSCOPY WITH URETEROSCOPY;  Surgeon: Crist Fat, MD;  Location: [facility];  Service: Urology;  Laterality: Right;   KNEE SURGERY Left 97 month old   TUBAL LIGATION  2006    OB History     Gravida  3   Para  3   Term  3   Preterm      AB      Living  3      SAB      IAB      Ectopic      Multiple      Live Births               Home  Medications    Prior to Admission medications   Medication Sig Start Date End Date Taking? Authorizing Provider  amoxicillin-clavulanate (AUGMENTIN) 875-125 MG tablet Take 1 tablet by mouth every 12 (twelve) hours. 05/16/23  Yes Eustace Moore, MD  fexofenadine (ALLEGRA) 180 MG tablet Take 180 mg by mouth daily.   Yes [provider]  predniSONE (DELTASONE) 20 MG tablet Take 1 tablet (20 mg total) by mouth 2 (two) times daily with a meal. 05/16/23  Yes Eustace Moore, MD    Family History Family History  Problem Relation Age of Onset   Hypertension Mother    Breast cancer Mother    Ovarian cancer Mother    Heart disease Mother    Drug abuse Father    Depression Father    Cancer Father    Arthritis Father    Alcohol abuse Father    Hypertension Brother    Hyperlipidemia Brother    Irritable bowel syndrome Brother    Asthma Brother    Hyperlipidemia Brother    Hypertension Brother    Heart disease Maternal Grandfather    Cancer Other    Heart  failure Other     Social History Social History   Tobacco Use   Smoking status: Former    Current packs/day: 1.00    Average packs/day: 1 pack/day for 10.0 years (10.0 ttl pk-yrs)    Types: Cigarettes   Smokeless tobacco: Never  Substance Use Topics   Alcohol use: Yes   Drug use: No     Allergies   Pertussis vaccines   Review of Systems Review of Systems  See HPI Physical Exam Triage Vital Signs ED Triage Vitals  Encounter Vitals Group     BP 05/16/23 1437 134/72     Systolic BP Percentile --      Diastolic BP Percentile --      Pulse Rate 05/16/23 1437 90     Resp 05/16/23 1437 16     Temp 05/16/23 1437 98.4 F (36.9 C)     Temp src --      SpO2 05/16/23 1437 99 %     Weight --      Height --      Head Circumference --      Peak Flow --      Pain Score 05/16/23 1439 6     Pain Loc --      Pain Education --      Exclude from Growth Chart --    No data found.  Updated Vital Signs BP  134/72 (BP Location: Left Arm)   Pulse 90   Temp 98.4 F (36.9 C)   Resp 16   SpO2 99%      Physical Exam Constitutional:      General: She is not in acute distress.    Appearance: She is well-developed and normal weight.  HENT:     Head: Normocephalic and atraumatic.     Ears:     Comments: Right TM is dull slightly retracted.  Left TM is dull and retracted with erythema    Nose: Congestion present.     Mouth/Throat:     Mouth: Mucous membranes are moist.     Pharynx: No posterior oropharyngeal erythema.  Eyes:     Conjunctiva/sclera: Conjunctivae normal.     Pupils: Pupils are equal, round, and reactive to light.  Cardiovascular:     Rate and Rhythm: Normal rate.  Pulmonary:     Effort: Pulmonary effort is normal. No respiratory distress.  Abdominal:     General: There is no distension.     Palpations: Abdomen is soft.  Musculoskeletal:        General: Normal range of motion.     Cervical back: Normal range of motion.  Lymphadenopathy:     Cervical: Cervical adenopathy present.  Skin:    General: Skin is warm and dry.  Neurological:     Mental Status: She is alert.      UC Treatments / Results  Labs (all labs ordered are listed, but only abnormal results are displayed) Labs Reviewed - No data to display  EKG   Radiology No results found.  Procedures Procedures (including critical care time)  Medications Ordered in UC Medications - No data to display  Initial Impression / Assessment and Plan / UC Course  I have reviewed the triage vital signs and the nursing notes.  Pertinent labs & imaging results that were available during my care of the patient were reviewed by me and considered in my medical decision making (see chart for details).     Final Clinical Impressions(s) / UC Diagnoses  Final diagnoses:  Acute frontal sinusitis, recurrence not specified  Acute left otitis media     Discharge Instructions      Take antibiotic 2 times a  day Take prednisone 2 times a day with food Drink lots of water May continue your allergy medicines Call for problems   ED Prescriptions     Medication Sig Dispense Auth. Provider   predniSONE (DELTASONE) 20 MG tablet Take 1 tablet (20 mg total) by mouth 2 (two) times daily with a meal. 10 tablet Eustace Moore, MD   amoxicillin-clavulanate (AUGMENTIN) 875-125 MG tablet Take 1 tablet by mouth every 12 (twelve) hours. 14 tablet Eustace Moore, MD      PDMP not reviewed this encounter.   Eustace Moore, MD 05/16/23 [phone]

## 2024-04-08 DIAGNOSIS — Z1339 Encounter for screening examination for other mental health and behavioral disorders: Secondary | ICD-10-CM | POA: Diagnosis not present

## 2024-04-08 DIAGNOSIS — E559 Vitamin D deficiency, unspecified: Secondary | ICD-10-CM | POA: Diagnosis not present

## 2024-04-08 DIAGNOSIS — D539 Nutritional anemia, unspecified: Secondary | ICD-10-CM | POA: Diagnosis not present

## 2024-04-08 DIAGNOSIS — Z13220 Encounter for screening for lipoid disorders: Secondary | ICD-10-CM | POA: Diagnosis not present

## 2024-04-08 DIAGNOSIS — N920 Excessive and frequent menstruation with regular cycle: Secondary | ICD-10-CM | POA: Diagnosis not present

## 2024-04-08 DIAGNOSIS — Z0000 Encounter for general adult medical examination without abnormal findings: Secondary | ICD-10-CM | POA: Diagnosis not present

## 2024-04-08 DIAGNOSIS — Z131 Encounter for screening for diabetes mellitus: Secondary | ICD-10-CM | POA: Diagnosis not present

## 2024-04-08 DIAGNOSIS — Z6837 Body mass index (BMI) 37.0-37.9, adult: Secondary | ICD-10-CM | POA: Diagnosis not present

## 2024-04-08 DIAGNOSIS — Z1331 Encounter for screening for depression: Secondary | ICD-10-CM | POA: Diagnosis not present

## 2024-05-27 DIAGNOSIS — E559 Vitamin D deficiency, unspecified: Secondary | ICD-10-CM | POA: Diagnosis not present

## 2024-05-27 DIAGNOSIS — N920 Excessive and frequent menstruation with regular cycle: Secondary | ICD-10-CM | POA: Diagnosis not present

## 2024-05-27 DIAGNOSIS — Z6836 Body mass index (BMI) 36.0-36.9, adult: Secondary | ICD-10-CM | POA: Diagnosis not present

## 2024-06-25 DIAGNOSIS — N920 Excessive and frequent menstruation with regular cycle: Secondary | ICD-10-CM | POA: Diagnosis not present

## 2024-06-25 DIAGNOSIS — Z6835 Body mass index (BMI) 35.0-35.9, adult: Secondary | ICD-10-CM | POA: Diagnosis not present

## 2024-06-25 DIAGNOSIS — J45990 Exercise induced bronchospasm: Secondary | ICD-10-CM | POA: Diagnosis not present

## 2024-06-25 DIAGNOSIS — E611 Iron deficiency: Secondary | ICD-10-CM | POA: Diagnosis not present

## 2024-07-25 DIAGNOSIS — J45990 Exercise induced bronchospasm: Secondary | ICD-10-CM | POA: Diagnosis not present

## 2024-07-25 DIAGNOSIS — N920 Excessive and frequent menstruation with regular cycle: Secondary | ICD-10-CM | POA: Diagnosis not present

## 2024-07-25 DIAGNOSIS — E611 Iron deficiency: Secondary | ICD-10-CM | POA: Diagnosis not present

## 2024-07-25 DIAGNOSIS — Z6834 Body mass index (BMI) 34.0-34.9, adult: Secondary | ICD-10-CM | POA: Diagnosis not present

## 2024-08-23 DIAGNOSIS — J45990 Exercise induced bronchospasm: Secondary | ICD-10-CM | POA: Diagnosis not present

## 2024-08-23 DIAGNOSIS — E611 Iron deficiency: Secondary | ICD-10-CM | POA: Diagnosis not present

## 2024-08-23 DIAGNOSIS — Z6833 Body mass index (BMI) 33.0-33.9, adult: Secondary | ICD-10-CM | POA: Diagnosis not present

## 2024-08-23 DIAGNOSIS — N920 Excessive and frequent menstruation with regular cycle: Secondary | ICD-10-CM | POA: Diagnosis not present

## 2024-11-11 ENCOUNTER — Encounter: Admitting: Obstetrics & Gynecology
